# Patient Record
Sex: Female | Born: 1935 | Race: White | Hispanic: No
Health system: Southern US, Community
[De-identification: ages and names within clinical notes are randomized; demographics above are authoritative.]

---

## 2020-07-22 ENCOUNTER — Other Ambulatory Visit: Payer: Self-pay

## 2020-07-22 DIAGNOSIS — Z20822 Contact with and (suspected) exposure to covid-19: Secondary | ICD-10-CM

## 2020-07-25 ENCOUNTER — Telehealth: Payer: Self-pay

## 2020-07-25 NOTE — Telephone Encounter (Signed)
Pt's home health nurse called for results advised results are not back. 

## 2020-07-26 LAB — NOVEL CORONAVIRUS, NAA: SARS-CoV-2, NAA: NOT DETECTED

## 2022-03-12 ENCOUNTER — Emergency Department (HOSPITAL_COMMUNITY): Payer: Medicare Other

## 2022-03-12 ENCOUNTER — Inpatient Hospital Stay (HOSPITAL_COMMUNITY)
Admission: EM | Admit: 2022-03-12 | Discharge: 2022-03-15 | DRG: 064 | Disposition: A | Discharge status: Hospice | Payer: Medicare Other | Source: Skilled Nursing Facility | Attending: Family Medicine | Admitting: Family Medicine

## 2022-03-12 ENCOUNTER — Observation Stay (HOSPITAL_COMMUNITY): Payer: Medicare Other

## 2022-03-12 ENCOUNTER — Encounter (HOSPITAL_COMMUNITY): Payer: Self-pay | Admitting: Emergency Medicine

## 2022-03-12 ENCOUNTER — Other Ambulatory Visit: Payer: Self-pay

## 2022-03-12 DIAGNOSIS — Z8673 Personal history of transient ischemic attack (TIA), and cerebral infarction without residual deficits: Secondary | ICD-10-CM

## 2022-03-12 DIAGNOSIS — I10 Essential (primary) hypertension: Secondary | ICD-10-CM | POA: Diagnosis present

## 2022-03-12 DIAGNOSIS — E876 Hypokalemia: Secondary | ICD-10-CM | POA: Diagnosis not present

## 2022-03-12 DIAGNOSIS — R471 Dysarthria and anarthria: Secondary | ICD-10-CM | POA: Diagnosis present

## 2022-03-12 DIAGNOSIS — R627 Adult failure to thrive: Secondary | ICD-10-CM | POA: Diagnosis present

## 2022-03-12 DIAGNOSIS — Z91040 Latex allergy status: Secondary | ICD-10-CM

## 2022-03-12 DIAGNOSIS — G9341 Metabolic encephalopathy: Secondary | ICD-10-CM | POA: Diagnosis present

## 2022-03-12 DIAGNOSIS — F03918 Unspecified dementia, unspecified severity, with other behavioral disturbance: Secondary | ICD-10-CM | POA: Diagnosis present

## 2022-03-12 DIAGNOSIS — Z881 Allergy status to other antibiotic agents status: Secondary | ICD-10-CM

## 2022-03-12 DIAGNOSIS — Z66 Do not resuscitate: Secondary | ICD-10-CM | POA: Diagnosis present

## 2022-03-12 DIAGNOSIS — F01518 Vascular dementia, unspecified severity, with other behavioral disturbance: Secondary | ICD-10-CM | POA: Diagnosis present

## 2022-03-12 DIAGNOSIS — F05 Delirium due to known physiological condition: Secondary | ICD-10-CM | POA: Diagnosis present

## 2022-03-12 DIAGNOSIS — R4701 Aphasia: Secondary | ICD-10-CM | POA: Diagnosis present

## 2022-03-12 DIAGNOSIS — I639 Cerebral infarction, unspecified: Secondary | ICD-10-CM | POA: Diagnosis not present

## 2022-03-12 DIAGNOSIS — I6381 Other cerebral infarction due to occlusion or stenosis of small artery: Secondary | ICD-10-CM | POA: Diagnosis not present

## 2022-03-12 DIAGNOSIS — Z515 Encounter for palliative care: Secondary | ICD-10-CM

## 2022-03-12 DIAGNOSIS — R159 Full incontinence of feces: Secondary | ICD-10-CM | POA: Diagnosis present

## 2022-03-12 DIAGNOSIS — R4182 Altered mental status, unspecified: Secondary | ICD-10-CM

## 2022-03-12 DIAGNOSIS — Z79899 Other long term (current) drug therapy: Secondary | ICD-10-CM

## 2022-03-12 DIAGNOSIS — R29706 NIHSS score 6: Secondary | ICD-10-CM | POA: Diagnosis present

## 2022-03-12 DIAGNOSIS — F0154 Vascular dementia, unspecified severity, with anxiety: Secondary | ICD-10-CM | POA: Diagnosis present

## 2022-03-12 DIAGNOSIS — Z7982 Long term (current) use of aspirin: Secondary | ICD-10-CM

## 2022-03-12 DIAGNOSIS — Z853 Personal history of malignant neoplasm of breast: Secondary | ICD-10-CM

## 2022-03-12 DIAGNOSIS — E785 Hyperlipidemia, unspecified: Secondary | ICD-10-CM | POA: Diagnosis present

## 2022-03-12 DIAGNOSIS — I2510 Atherosclerotic heart disease of native coronary artery without angina pectoris: Secondary | ICD-10-CM | POA: Diagnosis present

## 2022-03-12 DIAGNOSIS — E039 Hypothyroidism, unspecified: Secondary | ICD-10-CM | POA: Diagnosis present

## 2022-03-12 DIAGNOSIS — R32 Unspecified urinary incontinence: Secondary | ICD-10-CM | POA: Diagnosis present

## 2022-03-12 LAB — APTT: aPTT: 29 s (ref 24–36)

## 2022-03-12 LAB — CBC
HCT: 38.1 % (ref 36.0–46.0)
Hemoglobin: 12.4 g/dL (ref 12.0–15.0)
MCH: 29.3 pg (ref 26.0–34.0)
MCHC: 32.5 g/dL (ref 30.0–36.0)
MCV: 90.1 fL (ref 80.0–100.0)
Platelets: 168 K/uL (ref 150–400)
RBC: 4.23 MIL/uL (ref 3.87–5.11)
RDW: 14 % (ref 11.5–15.5)
WBC: 5 K/uL (ref 4.0–10.5)
nRBC: 0 % (ref 0.0–0.2)

## 2022-03-12 LAB — DIFFERENTIAL
Abs Immature Granulocytes: 0.01 10*3/uL (ref 0.00–0.07)
Basophils Absolute: 0.1 10*3/uL (ref 0.0–0.1)
Basophils Relative: 1 %
Eosinophils Absolute: 0.1 10*3/uL (ref 0.0–0.5)
Eosinophils Relative: 1 %
Immature Granulocytes: 0 %
Lymphocytes Relative: 25 %
Lymphs Abs: 1.3 10*3/uL (ref 0.7–4.0)
Monocytes Absolute: 0.4 10*3/uL (ref 0.1–1.0)
Monocytes Relative: 7 %
Neutro Abs: 3.3 10*3/uL (ref 1.7–7.7)
Neutrophils Relative %: 66 %

## 2022-03-12 LAB — COMPREHENSIVE METABOLIC PANEL
ALT: 12 U/L (ref 0–44)
AST: 20 U/L (ref 15–41)
Albumin: 3 g/dL — ABNORMAL LOW (ref 3.5–5.0)
Alkaline Phosphatase: 49 U/L (ref 38–126)
Anion gap: 5 (ref 5–15)
BUN: 10 mg/dL (ref 8–23)
CO2: 28 mmol/L (ref 22–32)
Calcium: 8.2 mg/dL — ABNORMAL LOW (ref 8.9–10.3)
Chloride: 111 mmol/L (ref 98–111)
Creatinine, Ser: 0.67 mg/dL (ref 0.44–1.00)
GFR, Estimated: >60 mL/min (ref >60)
Glucose, Bld: 89 mg/dL (ref 70–99)
Potassium: 2.7 mmol/L — CL (ref 3.5–5.1)
Sodium: 144 mmol/L (ref 135–145)
Total Bilirubin: 0.6 mg/dL (ref 0.3–1.2)
Total Protein: 5.6 g/dL — ABNORMAL LOW (ref 6.5–8.1)

## 2022-03-12 LAB — I-STAT CHEM 8, ED
BUN: 12 mg/dL (ref 8–23)
Calcium, Ion: 1.11 mmol/L — ABNORMAL LOW (ref 1.15–1.40)
Chloride: 106 mmol/L (ref 98–111)
Creatinine, Ser: 0.6 mg/dL (ref 0.44–1.00)
Glucose, Bld: 88 mg/dL (ref 70–99)
HCT: 35 % — ABNORMAL LOW (ref 36.0–46.0)
Hemoglobin: 11.9 g/dL — ABNORMAL LOW (ref 12.0–15.0)
Potassium: 3.1 mmol/L — ABNORMAL LOW (ref 3.5–5.1)
Sodium: 143 mmol/L (ref 135–145)
TCO2: 28 mmol/L (ref 22–32)

## 2022-03-12 LAB — PROTIME-INR
INR: 1.1 (ref 0.8–1.2)
Prothrombin Time: 13.9 seconds (ref 11.4–15.2)

## 2022-03-12 LAB — URINALYSIS, ROUTINE W REFLEX MICROSCOPIC
Bilirubin Urine: NEGATIVE
Glucose, UA: NEGATIVE mg/dL
Hgb urine dipstick: NEGATIVE
Ketones, ur: NEGATIVE mg/dL
Leukocytes,Ua: NEGATIVE
Nitrite: NEGATIVE
Protein, ur: NEGATIVE mg/dL
Specific Gravity, Urine: 1.02 (ref 1.005–1.030)
pH: 6 (ref 5.0–8.0)

## 2022-03-12 LAB — TSH: TSH: 1.467 u[IU]/mL (ref 0.350–4.500)

## 2022-03-12 LAB — MAGNESIUM: Magnesium: 1.9 mg/dL (ref 1.7–2.4)

## 2022-03-12 LAB — AMMONIA: Ammonia: 17 umol/L (ref 9–35)

## 2022-03-12 MED ORDER — SODIUM CHLORIDE 0.9% FLUSH
3.0000 mL | Freq: Once | INTRAVENOUS | Status: AC
  Administered 2022-03-12: 3 mL via INTRAVENOUS

## 2022-03-12 MED ORDER — SENNOSIDES-DOCUSATE SODIUM 8.6-50 MG PO TABS
1.0000 | ORAL_TABLET | Freq: Every evening | ORAL | Status: DC | PRN
  Filled 2022-03-12: qty 1

## 2022-03-12 MED ORDER — POTASSIUM CHLORIDE 10 MEQ/100ML IV SOLN
INTRAVENOUS | Status: AC
  Administered 2022-03-12: 10 meq
  Filled 2022-03-12: qty 100

## 2022-03-12 MED ORDER — LORAZEPAM 2 MG/ML IJ SOLN
1.0000 mg | Freq: Once | INTRAMUSCULAR | Status: AC
  Administered 2022-03-12: 1 mg via INTRAVENOUS
  Filled 2022-03-12: qty 1

## 2022-03-12 MED ORDER — ACETAMINOPHEN 650 MG RE SUPP: 650.0000 mg | RECTAL | Status: DC | PRN

## 2022-03-12 MED ORDER — POTASSIUM CHLORIDE 10 MEQ/100ML IV SOLN
10.0000 meq | INTRAVENOUS | Status: AC
  Administered 2022-03-12 (×3): 10 meq via INTRAVENOUS
  Filled 2022-03-12 (×3): qty 100

## 2022-03-12 MED ORDER — SODIUM CHLORIDE 0.9 % IV SOLN: INTRAVENOUS | Status: DC

## 2022-03-12 MED ORDER — ACETAMINOPHEN 325 MG PO TABS
650.0000 mg | ORAL_TABLET | ORAL | Status: DC | PRN
  Administered 2022-03-15: 650 mg via ORAL
  Filled 2022-03-12 (×2): qty 2

## 2022-03-12 MED ORDER — ATORVASTATIN CALCIUM 80 MG PO TABS
80.0000 mg | ORAL_TABLET | Freq: Every day | ORAL | Status: DC
  Administered 2022-03-13 – 2022-03-15 (×3): 80 mg via ORAL
  Filled 2022-03-12 (×5): qty 1

## 2022-03-12 MED ORDER — ACETAMINOPHEN 160 MG/5ML PO SOLN: 650.0000 mg | ORAL | Status: DC | PRN

## 2022-03-12 MED ORDER — ASPIRIN EC 81 MG PO TBEC
81.0000 mg | DELAYED_RELEASE_TABLET | Freq: Every day | ORAL | Status: DC
  Filled 2022-03-12: qty 1

## 2022-03-12 MED ORDER — STROKE: EARLY STAGES OF RECOVERY BOOK
Freq: Once | Status: DC
  Filled 2022-03-12 (×2): qty 1

## 2022-03-12 NOTE — ED Notes (Signed)
Pt returned from MRI °

## 2022-03-12 NOTE — ED Notes (Signed)
Lab called to add on mg.  ?

## 2022-03-12 NOTE — ED Triage Notes (Signed)
Pt BIB EMS from [facility] memory care, facility reports that pt was [facility] prior to going to bed. Found at approx 9am slumped over in her wheelchair, has had increased confusion, aphasia, and difficult to arouse. EMS reports that family states pt has hx of TIA.  ?

## 2022-03-12 NOTE — ED Notes (Signed)
MD Criss Alvine made aware of K+ 2.7 ?

## 2022-03-12 NOTE — Assessment & Plan Note (Signed)
-   will replace and repeat in AM,  check magnesium level and replace as needed ° °

## 2022-03-12 NOTE — Assessment & Plan Note (Signed)
-   will admit based on TIA/CVA protocol       Monitor on Tele ?       MRI Resulted - showing acute ischemic CVA  ?     NEurology have seen pt suspect MRI abnormality is an incidental finding ?      Echo to evaluate for possible embolic source,  ?      obtain cardiac enzymes,  ECG,   Lipid panel, TSH WNL.  ?      Order PT/OT evaluation.  ?      keep nothing by mouth until passes swallow eval  ?      Will make sure patient is on antiplatelet ASA 81  agent and statin  ?      Allow permissive Hypertension keep BP <220/120  ?      Neurology consulted Have seen pt in ER  ? ?

## 2022-03-12 NOTE — ED Notes (Signed)
Pt transported to MRI 

## 2022-03-12 NOTE — Assessment & Plan Note (Addendum)
Felt to be due to sundowning ?UA no evidence of infection ?MRi showing small right occipital CVA,  ?If pt vision was affected it could have upset her resulting in aggressive behavior ?EEG per neurology  ? ?

## 2022-03-12 NOTE — Code Documentation (Addendum)
Stroke Response Nurse Documentation ?Code Documentation ? ?Tammie Wells is a 86 y.o. female arriving to [facility]  via [facility] on 03/12/22 with past medical hx of TIA. On No antithrombotic. Code stroke was activated by EMS.  ? ?Patient from [facility] where she was LKW at unknown time last evening and now complaining of aphasia. Pt was LKW sometime last evening, third shift was going to get pt ready for breakfast. They found her slumped over in the wheelchair with AMS beyond baseline. EMS was called.   ? ?Stroke team at the bedside on patient arrival. Labs drawn and patient cleared for CT by EDP. Patient to CT with team. NIHSS 6, see documentation for details and code stroke times. Patient with disoriented, right hemianopia, and Expressive aphasia  on exam. The following imaging was completed:  CT Head. Patient is not a candidate for IV Thrombolytic due to outside the window. Patient is not not a candidate for IR due to MRS high and scan results per MD.  ? ?Care Plan: Q2x12 then Q4 Vitals/NIHSS, permissive HTN <220/120.  ? ?Bedside handoff with ED RN Tammie Wells.   ? ?Tammie Wells, Tammie Wells  ?Stroke Response RN ? ?Per EMS from the facility, POA is Tammie Wells and no one else should get information about the patient but her.  ?

## 2022-03-12 NOTE — ED Provider Notes (Signed)
?[facility] EMERGENCY DEPARTMENT ?Provider Note ? ? ?CSN: [id number] ?Arrival date & time: 03/12/22  1202 ? ?An emergency department physician performed an initial assessment on this suspected stroke patient at 1202. ? ?History ? ?Chief Complaint  ?Patient presents with  ? Code Stroke  ? ? ?Tammie Wells is a 86 y.o. female. ? ?86 year old female brought in by EMS from [facility] living memory care who reports change in mental status, last known well prior to going to bed last night, found at 9 AM slumped over in a wheelchair with increasing confusion, aphasia, difficulty to arouse.  Past medical history of, vascular dementia, hypertension, hyperlipidemia, breast cancer. ?Patient is unable to provide any history, level 5 caveat applies. ? ? ?  ? ?Home Medications ?Prior to Admission medications   ?Medication Sig Start Date End Date Taking? Authorizing Provider  ?aspirin 81 MG EC tablet Take 81 mg by mouth daily.   Yes [provider]  ?atorvastatin (LIPITOR) 80 MG tablet Take 80 mg by mouth daily. 01/22/22  Yes [provider]  ?gabapentin (NEURONTIN) 100 MG capsule Take 100 mg by mouth 2 (two) times daily. 02/24/22  Yes [provider]  ?lisinopril (ZESTRIL) 20 MG tablet Take 20 mg by mouth daily. 01/22/22  Yes [provider]  ?metoprolol succinate (TOPROL-XL) 25 MG 24 hr tablet Take 25 mg by mouth daily. 01/22/22  Yes [provider]  ?sertraline (ZOLOFT) 50 MG tablet Take 50 mg by mouth daily. 01/22/22  Yes [provider]  ?Vitamin D, Ergocalciferol, (DRISDOL) 1.25 MG (50000 UNIT) CAPS capsule Take 50,000 Units by mouth once a week. 12/19/21  Yes [provider]  ?   ? ?Allergies    ?Latex and Sulfa antibiotics   ? ?Review of Systems   ?Review of Systems  ?Unable to perform ROS: Dementia  ? ?Physical Exam ?Updated Vital Signs ?BP (!) 163/103   Pulse 95   Temp 98.8 ?F (37.1 ?C) (Oral)   Resp 20   Wt 54 kg   SpO2 96%  ?Physical  Exam ?Vitals and nursing note reviewed.  ?Constitutional:   ?   General: She is not in acute distress. ?   Appearance: She is well-developed. She is not diaphoretic.  ?HENT:  ?   Head: Normocephalic and atraumatic.  ?   Nose: Nose normal.  ?   Mouth/Throat:  ?   Mouth: Mucous membranes are dry.  ?Eyes:  ?   Conjunctiva/sclera: Conjunctivae normal.  ?   Pupils: Pupils are equal, round, and reactive to light.  ?Cardiovascular:  ?   Rate and Rhythm: Normal rate and regular rhythm.  ?   Pulses: Normal pulses.  ?   Heart sounds: Normal heart sounds.  ?Pulmonary:  ?   Effort: Pulmonary effort is normal.  ?   Breath sounds: Normal breath sounds.  ?Abdominal:  ?   Palpations: Abdomen is soft.  ?   Tenderness: There is no abdominal tenderness.  ?Musculoskeletal:     ?   General: No swelling, tenderness or deformity.  ?   Cervical back: Neck supple.  ?   Right lower leg: No edema.  ?   Left lower leg: No edema.  ?Skin: ?   General: Skin is warm and dry.  ?   Findings: No erythema or rash.  ?Neurological:  ?   Mental Status: She is alert.  ?   Comments: Alert to person only. Does follow simple commands.  ?Psychiatric:     ?  Behavior: Behavior normal.  ? ? ?ED Results / Procedures / Treatments   ?Labs ?(all labs ordered are listed, but only abnormal results are displayed) ?Labs Reviewed  ?COMPREHENSIVE METABOLIC PANEL - Abnormal; Notable for the following components:  ?    Result Value  ? Potassium 2.7 (*)   ? Calcium 8.2 (*)   ? Total Protein 5.6 (*)   ? Albumin 3.0 (*)   ? All other components within normal limits  ?I-STAT CHEM 8, ED - Abnormal; Notable for the following components:  ? Potassium 3.1 (*)   ? Calcium, Ion 1.11 (*)   ? Hemoglobin 11.9 (*)   ? HCT 35.0 (*)   ? All other components within normal limits  ?PROTIME-INR  ?APTT  ?CBC  ?DIFFERENTIAL  ?URINALYSIS, ROUTINE W REFLEX MICROSCOPIC  ?MAGNESIUM  ?AMMONIA  ?TSH  ?CBG MONITORING, ED  ? ? ?EKG ?None ? ?Radiology ?MR BRAIN WO CONTRAST ? ?Result Date:  03/12/2022 ?CLINICAL DATA:  Found approximately 9 a.m. slumped over in wheelchair, confusion, aphasia, difficulty arousing. History of TIA EXAM: MRI HEAD WITHOUT CONTRAST TECHNIQUE: Multiplanar, multiecho pulse sequences of the brain and surrounding structures were obtained without intravenous contrast. COMPARISON:  Same-day noncontrast CT head FINDINGS: Study could not be completed due to patient cooperation. Motion degraded axial and coronal DWI, and axial FLAIR sequences were obtained. Brain: There is a small focus of elevated DWI signal in the right occipital lobe periventricular white matter seen on the coronal DWI sequence suspicious for acute to early subacute infarct (9-42). There is no other evidence of acute infarct. There is no large intraparenchymal hemorrhage. There is mild-to-moderate global parenchymal volume loss with prominence of the ventricular system and extra-axial CSF spaces. The ventricles are similar in size compared to the CT from 2020. There is extensive confluent FLAIR signal abnormality throughout the subcortical and periventricular white matter likely reflecting sequela of advanced chronic white matter microangiopathy. There are remote lacunar infarcts in the bilateral thalami and left lentiform nucleus. There is no large mass lesion.  There is no midline shift. Vascular: Not well assessed. Skull and upper cervical spine: Not assessed. Sinuses/Orbits: Paranasal sinuses appear grossly clear. The globes and orbits are not well assessed. Other: None. IMPRESSION: 1. Incomplete study due to patient cooperation as above. 2. Probable small acute to early subacute infarct in the right occipital lobe periventricular white matter. No other evidence of acute intracranial pathology. 3. Global parenchymal volume loss and advanced chronic white matter microangiopathy. Electronically Signed   By: Lesia Hausen M.D.   On: 03/12/2022 17:52  ? ?CT HEAD CODE STROKE WO CONTRAST ? ?Result Date:  03/12/2022 ?CLINICAL DATA:  Code stroke. Neuro deficit, acute, stroke suspected. EXAM: CT HEAD WITHOUT CONTRAST TECHNIQUE: Contiguous axial images were obtained from the base of the skull through the vertex without intravenous contrast. RADIATION DOSE REDUCTION: This exam was performed according to the departmental dose-optimization program which includes automated exposure control, adjustment of the mA and/or kV according to patient size and/or use of iterative reconstruction technique. COMPARISON:  06/10/2019 FINDINGS: Brain: No acute finding. Extensive chronic small-vessel ischemic changes throughout the cerebral hemispheric white matter. Old small vessel infarctions of the thalami and basal ganglia. No large vessel territory stroke. No mass, hemorrhage, hydrocephalus or extra-axial collection. Vascular: There is atherosclerotic calcification of the major vessels at the base of the brain. Skull: Negative Sinuses/Orbits: Clear/normal Other: None ASPECTS (Alberta Stroke Program Early CT Score) - Ganglionic level infarction (caudate, lentiform nuclei, internal capsule, insula, M1-M3 cortex):  7 - Supraganglionic infarction (M4-M6 cortex): 3 Total score (0-10 with 10 being normal): 10 IMPRESSION: 1. No acute CT finding. Advanced chronic small-vessel ischemic changes of the thalami, basal ganglia and cerebral hemispheric white matter. 2. ASPECTS is 10. 3. These results were communicated to Dr. Amada JupiterKirkpatrick at 12:17 pm on 03/12/2022 by text page via the [facility] messaging system. Electronically Signed   By: Paulina FusiMark  Shogry M.D.   On: 03/12/2022 12:18   ? ?Procedures ?Procedures  ? ? ?Medications Ordered in ED ?Medications  ?potassium chloride 10 mEq in 100 mL IVPB (10 mEq Intravenous New Bag/Given 03/12/22 1750)  ?sodium chloride flush (NS) 0.9 % injection 3 mL (3 mLs Intravenous Given 03/12/22 1229)  ?LORazepam (ATIVAN) injection 1 mg (1 mg Intravenous Given 03/12/22 1753)  ? ? ?ED Course/ Medical Decision Making/ A&P ?  ?                         ?Medical Decision Making ?Amount and/or Complexity of Data Reviewed ?Labs: ordered. ?Radiology: ordered. ? ?Risk ?Prescription drug management. ?Decision regarding hospitalization. ? ? ?This patient presents to the ED for concern

## 2022-03-12 NOTE — H&P (Signed)
? ? ?Tammie Medicusnn Winward ZOX:[id number] DOB: [birth year] DOA: 03/12/2022 ? ? ?  ?PCP: Ashley Royaltyhigpen, Jacqueline R, NP   ?  ? ?Patient arrived to ER on 03/12/22 at 1202 ?Referred by Attending Therisa Doyneoutova, Fareed Fung, MD ? ? ?Patient coming from:    ?From facility   ? ?Chief Complaint:  ?Chief Complaint  ?Patient presents with  ? Code Stroke  ? ? ?HPI: ?Tammie Wells is a 86 y.o. female with medical history significant of Dementia, expressive aphasia ?  ? ?Presented with   confusion ?Was brought in from facility for worsening confusion and episode of aphasia ?Pt with significant dementia unable to provide her own history ? At baseline bed bound, able to feed her finger food ?  ?  ?Regarding pertinent Chronic problems:   ?  ? ? HTN on lisinopril, metoprolol ?  ? ?  ?  Dementia -  ON NEURONTIN ?  ? ?While in ER: ?  ? ?MRI - Probable small acute to early subacute infarct in the right ?occipital lobe periventricular white matter. ? ? ?Ordered ? ?CT HEAD No acute CT finding. Advanced chronic small-vessel ischemic ?changes of the thalami, basal ganglia and cerebral hemispheric white ?matter. ? ?CXR - Negative abdominal radiographs.  No acute cardiopulmonary disease. ?KUB - WNL ? ? ?Following Medications were ordered in ER: ?Medications  ?potassium chloride 10 mEq in 100 mL IVPB (10 mEq Intravenous New Bag/Given 03/12/22 1750)  ?sodium chloride flush (NS) 0.9 % injection 3 mL (3 mLs Intravenous Given 03/12/22 1229)  ?LORazepam (ATIVAN) injection 1 mg (1 mg Intravenous Given 03/12/22 1753)  ?  ?_______________________________________________________ ?ER Provider Called:  Neurology   Dr. Amada JupiterKirkpatrick ?They Recommend admit to medicine  ?SEEN in ER ?  ?ED Triage Vitals  ?Enc Vitals Group  ?   BP 03/12/22 1224 (!) 173/84  ?   Pulse Rate 03/12/22 1224 73  ?   Resp 03/12/22 1224 18  ?   Temp 03/12/22 1224 98.8 ?F (37.1 ?C)  ?   Temp Source 03/12/22 1224 Oral  ?   SpO2 03/12/22 1220 96 %  ?   Weight 03/12/22 1200 119 lb 0.8 oz (54 kg)  ?   Height --   ?   Head  Circumference --   ?   Peak Flow --   ?   Pain Score --   ?   Pain Loc --   ?   Pain Edu? --   ?   Excl. in GC? --   ?WJXB(14)@TMAX(24)@    ? _________________________________________ ?Significant initial  Findings: ?Abnormal Labs Reviewed  ?COMPREHENSIVE METABOLIC PANEL - Abnormal; Notable for the following components:  ?    Result Value  ? Potassium 2.7 (*)   ? Calcium 8.2 (*)   ? Total Protein 5.6 (*)   ? Albumin 3.0 (*)   ? All other components within normal limits  ?I-STAT CHEM 8, ED - Abnormal; Notable for the following components:  ? Potassium 3.1 (*)   ? Calcium, Ion 1.11 (*)   ? Hemoglobin 11.9 (*)   ? HCT 35.0 (*)   ? All other components within normal limits  ? ? ?ECG: Ordered ?Personally reviewed by me showing: ?HR : 66  ?Rhythm: Sinus rhythm ?Ventricular trigeminy ?Consider anterior infarct ?QTC 475 ? ? ?The recent clinical data is shown below. ?Vitals:  ? 03/12/22 1615 03/12/22 1630 03/12/22 1800 03/12/22 1813  ?BP: (!) 190/104 (!) 177/109 (!) 177/104 (!) 163/103  ?Pulse: 87 85 98 95  ?Resp: 17  19 20 20   ?Temp:      ?TempSrc:      ?SpO2: 98% 98% 99% 96%  ?Weight:      ? ?   ?WBC ? ?   ?Component Value Date/Time  ? WBC 5.0 03/12/2022 1247  ? LYMPHSABS 1.3 03/12/2022 1247  ? MONOABS 0.4 03/12/2022 1247  ? EOSABS 0.1 03/12/2022 1247  ? BASOSABS 0.1 03/12/2022 1247  ?  ? ? UA   no evidence of UTI    ?  ?Urine analysis: ?   ?Component Value Date/Time  ? COLORURINE YELLOW 03/12/2022 1320  ? APPEARANCEUR CLEAR 03/12/2022 1320  ? LABSPEC 1.020 03/12/2022 1320  ? PHURINE 6.0 03/12/2022 1320  ? GLUCOSEU NEGATIVE 03/12/2022 1320  ? HGBUR NEGATIVE 03/12/2022 1320  ? [place] NEGATIVE 03/12/2022 1320  ? [place] NEGATIVE 03/12/2022 1320  ? PROTEINUR NEGATIVE 03/12/2022 1320  ? NITRITE NEGATIVE 03/12/2022 1320  ? LEUKOCYTESUR NEGATIVE 03/12/2022 1320  ?  ? ?_______________________________________________ ?Hospitalist was called for admission for acute encephalopathy, small right occipital CVA ?   ?Hypokalemia ?  ?   ? ?The following Work up has been ordered so far: ? ?Orders Placed This Encounter  ?Procedures  ? CT HEAD CODE STROKE WO CONTRAST  ? MR BRAIN WO CONTRAST  ? DG Abd Acute W/Chest  ? Protime-INR  ? APTT  ? CBC  ? Differential  ? Comprehensive metabolic panel  ? Urinalysis, Routine w reflex microscopic Urine, Clean Catch  ? Magnesium  ? Ammonia  ? TSH  ? Basic metabolic panel  ? CK  ? Phosphorus  ? Prealbumin  ? Diet NPO time specified  ? Cardiac monitoring  ? Swallow screen  ? NIH Stroke Scale  ? Modified Stroke Scale (mNIHSS) Document mNIHSS assessment every 2 hours for a total of 12 hours  ? [place] IV, Maintain IV access  ? If O2 sat  ? Cardiac monitoring  ? Consult for [place] Admission  ? Consult to hospitalist  ? Pulse oximetry, continuous  ? I-stat chem 8, ED  ? CBG monitoring, ED  ? ED EKG  ? EKG 12-Lead  ? EEG adult  ? Place in observation (patient's expected length of stay will be less than 2 midnights)  ?  ? ?OTHER Significant initial  Findings: ? ?labs showing: ? ?  ?Recent Labs  ?Lab 03/12/22 ?1247 03/12/22 ?1357  ?NA 144 143  ?K 2.7* 3.1*  ?CO2 28  --   ?GLUCOSE 89 88  ?BUN 10 12  ?CREATININE 0.67 0.60  ?CALCIUM 8.2*  --   ?MG 1.9  --   ? ? ?Cr  stable,    ?Lab Results  ?Component Value Date  ? CREATININE 0.60 03/12/2022  ? CREATININE 0.67 03/12/2022  ? ? ?Recent Labs  ?Lab 03/12/22 ?1247  ?AST 20  ?ALT 12  ?ALKPHOS 49  ?BILITOT 0.6  ?PROT 5.6*  ?ALBUMIN 3.0*  ? ?Lab Results  ?Component Value Date  ? CALCIUM 8.2 (L) 03/12/2022  ? ?     ?Plt: ?Lab Results  ?Component Value Date  ? PLT 168 03/12/2022  ? ?  ?   ?Recent Labs  ?Lab 03/12/22 ?1247 03/12/22 ?1357  ?WBC 5.0  --   ?NEUTROABS 3.3  --   ?HGB 12.4 11.9*  ?HCT 38.1 35.0*  ?MCV 90.1  --   ?PLT 168  --   ? ? ?HG/HCT  stable,   ?   ?Component Value Date/Time  ? HGB 11.9 (L) 03/12/2022 1357  ?  HCT 35.0 (L) 03/12/2022 1357  ? MCV 90.1 03/12/2022 1247  ? ?  ? ?No results for input(s): LIPASE, AMYLASE in the last 168 hours. ?Recent Labs   ?Lab 03/12/22 ?1720  ?AMMONIA 17  ? ?   ?    ?Cultures: ?No results found for: SDES, [place], [place], REPTSTATUS ?  ?Radiological Exams on Admission: ?MR BRAIN WO CONTRAST ? ?Result Date: 03/12/2022 ?CLINICAL DATA:  Found approximately 9 a.m. slumped over in wheelchair, confusion, aphasia, difficulty arousing. History of TIA EXAM: MRI HEAD WITHOUT CONTRAST TECHNIQUE: Multiplanar, multiecho pulse sequences of the brain and surrounding structures were obtained without intravenous contrast. COMPARISON:  Same-day noncontrast CT head FINDINGS: Study could not be completed due to patient cooperation. Motion degraded axial and coronal DWI, and axial FLAIR sequences were obtained. Brain: There is a small focus of elevated DWI signal in the right occipital lobe periventricular white matter seen on the coronal DWI sequence suspicious for acute to early subacute infarct (9-42). There is no other evidence of acute infarct. There is no large intraparenchymal hemorrhage. There is mild-to-moderate global parenchymal volume loss with prominence of the ventricular system and extra-axial CSF spaces. The ventricles are similar in size compared to the CT from 2020. There is extensive confluent FLAIR signal abnormality throughout the subcortical and periventricular white matter likely reflecting sequela of advanced chronic white matter microangiopathy. There are remote lacunar infarcts in the bilateral thalami and left lentiform nucleus. There is no large mass lesion.  There is no midline shift. Vascular: Not well assessed. Skull and upper cervical spine: Not assessed. Sinuses/Orbits: Paranasal sinuses appear grossly clear. The globes and orbits are not well assessed. Other: None. IMPRESSION: 1. Incomplete study due to patient cooperation as above. 2. Probable small acute to early subacute infarct in the right occipital lobe periventricular white matter. No other evidence of acute intracranial pathology. 3. Global parenchymal volume  loss and advanced chronic white matter microangiopathy. Electronically Signed   By: Valetta Mole M.D.   On: 03/12/2022 17:52  ? ?CT HEAD CODE STROKE WO CONTRAST ? ?Result Date: 03/12/2022 ?CLINICAL DATA:  Code stroke.

## 2022-03-12 NOTE — Consult Note (Signed)
Neurology Consultation ?Reason for Consult: AMS ?Referring Physician: Elvin So, S ? ?CC: Altered mental status ? ?History is obtained from: Patient, EMS ? ?HPI: Tammie Wells is a 86 y.o. female with a history of fairly advanced dementia, requiring significant help to go about her daily activities.  She also has notes in the chart that she has a history of aphasia, but it was noted to be significantly worse today and therefore the patient was brought in as a code stroke. ? ?There is no clear instigating event, no clear recent illnesses. ? ? ?LKW: 4/30 prior to bed ?tpa given?: no, out of window ?Premorbid modified rankin scale: Four ? ? ? ? ?History reviewed. No pertinent past medical history. ? ? ?History reviewed. No pertinent family history. ? ? ?Social History:  has no history on file for tobacco use, alcohol use, and drug use. ? ? ?Exam: ?Current vital signs: ?BP (!) 179/93   Pulse 66   Temp 98.8 ?F (37.1 ?C) (Oral)   Resp 16   Wt 54 kg   SpO2 97%  ?Vital signs in last 24 hours: ?Temp:  [98.8 ?F (37.1 ?C)] 98.8 ?F (37.1 ?C) (05/01 1224) ?Pulse Rate:  [66-73] 66 (05/01 1515) ?Resp:  [12-18] 16 (05/01 1515) ?BP: (172-179)/(84-93) 179/93 (05/01 1515) ?SpO2:  [95 %-98 %] 97 % (05/01 1515) ?Weight:  [54 kg] 54 kg (05/01 1200) ? ? ?Physical Exam  ?Constitutional: Appears well-developed and well-nourished.  ?Psych: Affect appropriate to situation ?Eyes: No scleral injection ?HENT: No OP obstruction ?MSK: no joint deformities.  ?Cardiovascular: Normal rate and regular rhythm.  ?Respiratory: Effort normal, non-labored breathing ?GI: Soft.  No distension. There is no tenderness.  ?Skin: WDI ? ?Neuro: ?Mental Status: ?Patient is awake, alert, she is able to follow commands but has a severe expressive aphasia.   ?Cranial Nerves: ?II: Right hemianopia. Pupils are equal, round, and reactive to light.   ?III,IV, VI: EOMI without ptosis or diploplia.  ?V: Facial sensation is symmetric to temperature ?VII: Facial movement is  symmetric.  ?VIII: hearing is intact to voice ?X: Uvula elevates symmetrically ?XI: Shoulder shrug is symmetric. ?XII: tongue is midline without atrophy or fasciculations.  ?Motor: ?Tone is normal. Bulk is normal. 5/5 strength was present in all four extremities.  ?Sensory: ?Sensation is symmetric to light touch and temperature in the arms and legs. ?Cerebellar: ?No clear ataxia ? ? ? ?I have reviewed labs in epic and the results pertinent to this consultation are: ?Sodium 143 ?Creatinine 0.6 ? ?I have reviewed the images obtained: CT head-negative ? ?Impression: 86 year old female with advanced dementia with worsening confusion.  She does have a right hemianopia which has not been documented previously, but I am not certain how long it has been present given her last neurology note is from 2 years ago.  It is possible that this represents acute infarct, but also possible that it could represent worsening of underlying deficits in the setting of some other physiological stressors such as UTI or other infection. ? ?Recommendations: ?1) MRI brain ?2) screening for other metabolic stressors, infectious causes per ED. ?3) ammonia, TSH ?4) further recommendations pending the above. ? ?Ritta Slot, MD ?[facility] ?[phone] ? ?If 7pm- 7am, please page neurology on call as listed in AMION. ? ?

## 2022-03-12 NOTE — Subjective & Objective (Signed)
Was brought in from facility for worsening confusion and episode of aphasia ?Pt with significant dementia unable to provide her own history ?

## 2022-03-12 NOTE — Assessment & Plan Note (Signed)
Allow permissive HTN 

## 2022-03-12 NOTE — Assessment & Plan Note (Signed)
With evidence of sundowning, will continue to redirect and treat as needed ?

## 2022-03-12 NOTE — Progress Notes (Signed)
EEG complete - results pending 

## 2022-03-13 ENCOUNTER — Observation Stay (HOSPITAL_COMMUNITY): Payer: Medicare Other

## 2022-03-13 ENCOUNTER — Encounter (HOSPITAL_COMMUNITY): Payer: Self-pay | Admitting: Internal Medicine

## 2022-03-13 DIAGNOSIS — Z853 Personal history of malignant neoplasm of breast: Secondary | ICD-10-CM | POA: Diagnosis not present

## 2022-03-13 DIAGNOSIS — Z91040 Latex allergy status: Secondary | ICD-10-CM | POA: Diagnosis not present

## 2022-03-13 DIAGNOSIS — F01518 Vascular dementia, unspecified severity, with other behavioral disturbance: Secondary | ICD-10-CM | POA: Diagnosis present

## 2022-03-13 DIAGNOSIS — R4701 Aphasia: Secondary | ICD-10-CM | POA: Diagnosis present

## 2022-03-13 DIAGNOSIS — I6389 Other cerebral infarction: Secondary | ICD-10-CM

## 2022-03-13 DIAGNOSIS — R627 Adult failure to thrive: Secondary | ICD-10-CM | POA: Diagnosis present

## 2022-03-13 DIAGNOSIS — Z79899 Other long term (current) drug therapy: Secondary | ICD-10-CM | POA: Diagnosis not present

## 2022-03-13 DIAGNOSIS — Z66 Do not resuscitate: Secondary | ICD-10-CM | POA: Diagnosis present

## 2022-03-13 DIAGNOSIS — F0154 Vascular dementia, unspecified severity, with anxiety: Secondary | ICD-10-CM | POA: Diagnosis present

## 2022-03-13 DIAGNOSIS — E876 Hypokalemia: Secondary | ICD-10-CM | POA: Diagnosis present

## 2022-03-13 DIAGNOSIS — F03918 Unspecified dementia, unspecified severity, with other behavioral disturbance: Secondary | ICD-10-CM | POA: Diagnosis not present

## 2022-03-13 DIAGNOSIS — Z7982 Long term (current) use of aspirin: Secondary | ICD-10-CM | POA: Diagnosis not present

## 2022-03-13 DIAGNOSIS — E785 Hyperlipidemia, unspecified: Secondary | ICD-10-CM | POA: Diagnosis present

## 2022-03-13 DIAGNOSIS — G9341 Metabolic encephalopathy: Secondary | ICD-10-CM | POA: Diagnosis present

## 2022-03-13 DIAGNOSIS — R32 Unspecified urinary incontinence: Secondary | ICD-10-CM | POA: Diagnosis present

## 2022-03-13 DIAGNOSIS — I639 Cerebral infarction, unspecified: Secondary | ICD-10-CM | POA: Diagnosis present

## 2022-03-13 DIAGNOSIS — Z515 Encounter for palliative care: Secondary | ICD-10-CM | POA: Diagnosis not present

## 2022-03-13 DIAGNOSIS — R159 Full incontinence of feces: Secondary | ICD-10-CM | POA: Diagnosis present

## 2022-03-13 DIAGNOSIS — Z881 Allergy status to other antibiotic agents status: Secondary | ICD-10-CM | POA: Diagnosis not present

## 2022-03-13 DIAGNOSIS — Z8673 Personal history of transient ischemic attack (TIA), and cerebral infarction without residual deficits: Secondary | ICD-10-CM | POA: Diagnosis not present

## 2022-03-13 DIAGNOSIS — E039 Hypothyroidism, unspecified: Secondary | ICD-10-CM | POA: Diagnosis present

## 2022-03-13 DIAGNOSIS — F05 Delirium due to known physiological condition: Secondary | ICD-10-CM | POA: Diagnosis present

## 2022-03-13 DIAGNOSIS — R29706 NIHSS score 6: Secondary | ICD-10-CM | POA: Diagnosis present

## 2022-03-13 DIAGNOSIS — I10 Essential (primary) hypertension: Secondary | ICD-10-CM | POA: Diagnosis present

## 2022-03-13 DIAGNOSIS — I2510 Atherosclerotic heart disease of native coronary artery without angina pectoris: Secondary | ICD-10-CM | POA: Diagnosis present

## 2022-03-13 DIAGNOSIS — I6381 Other cerebral infarction due to occlusion or stenosis of small artery: Secondary | ICD-10-CM | POA: Diagnosis present

## 2022-03-13 LAB — ECHOCARDIOGRAM COMPLETE
AR max vel: 2.55 cm2
AV Peak grad: 6.1 mmHg
Ao pk vel: 1.23 m/s
Area-P 1/2: 7.74 cm2
S' Lateral: 2.5 cm
Weight: 1904.77 oz

## 2022-03-13 LAB — BASIC METABOLIC PANEL
Anion gap: 7 (ref 5–15)
BUN: 8 mg/dL (ref 8–23)
CO2: 23 mmol/L (ref 22–32)
Calcium: 9.1 mg/dL (ref 8.9–10.3)
Chloride: 109 mmol/L (ref 98–111)
Creatinine, Ser: 0.6 mg/dL (ref 0.44–1.00)
GFR, Estimated: >60 mL/min (ref >60)
Glucose, Bld: 81 mg/dL (ref 70–99)
Potassium: 3.4 mmol/L — ABNORMAL LOW (ref 3.5–5.1)
Sodium: 139 mmol/L (ref 135–145)

## 2022-03-13 LAB — LIPID PANEL
Cholesterol: 147 mg/dL (ref 0–200)
HDL: 39 mg/dL — ABNORMAL LOW (ref >40)
LDL Cholesterol: 93 mg/dL (ref 0–99)
Total CHOL/HDL Ratio: 3.8 RATIO
Triglycerides: 77 mg/dL (ref <150)
VLDL: 15 mg/dL (ref 0–40)

## 2022-03-13 LAB — HEMOGLOBIN A1C
Hgb A1c MFr Bld: 5 % (ref 4.8–5.6)
Mean Plasma Glucose: 96.8 mg/dL

## 2022-03-13 LAB — PHOSPHORUS: Phosphorus: 2.4 mg/dL — ABNORMAL LOW (ref 2.5–4.6)

## 2022-03-13 LAB — PREALBUMIN: Prealbumin: 15.3 mg/dL — ABNORMAL LOW (ref 18–38)

## 2022-03-13 LAB — CK: Total CK: 80 U/L (ref 38–234)

## 2022-03-13 MED ORDER — ASPIRIN EC 81 MG PO TBEC
81.0000 mg | DELAYED_RELEASE_TABLET | Freq: Every day | ORAL | Status: DC
  Administered 2022-03-14 – 2022-03-15 (×2): 81 mg via ORAL
  Filled 2022-03-13 (×2): qty 1

## 2022-03-13 MED ORDER — LISINOPRIL 20 MG PO TABS
20.0000 mg | ORAL_TABLET | Freq: Every day | ORAL | Status: DC
  Administered 2022-03-14 – 2022-03-15 (×2): 20 mg via ORAL
  Filled 2022-03-13 (×2): qty 1

## 2022-03-13 MED ORDER — SERTRALINE HCL 50 MG PO TABS
50.0000 mg | ORAL_TABLET | Freq: Every day | ORAL | Status: DC
Start: 2022-03-14 — End: 2022-03-16
  Administered 2022-03-14 – 2022-03-15 (×2): 50 mg via ORAL
  Filled 2022-03-13 (×2): qty 1

## 2022-03-13 MED ORDER — EZETIMIBE 10 MG PO TABS
10.0000 mg | ORAL_TABLET | Freq: Every day | ORAL | Status: DC
  Administered 2022-03-13 – 2022-03-15 (×3): 10 mg via ORAL
  Filled 2022-03-13 (×3): qty 1

## 2022-03-13 MED ORDER — METOPROLOL SUCCINATE ER 25 MG PO TB24
25.0000 mg | ORAL_TABLET | Freq: Every day | ORAL | Status: DC
  Administered 2022-03-14 – 2022-03-15 (×2): 25 mg via ORAL
  Filled 2022-03-13 (×2): qty 1

## 2022-03-13 MED ORDER — ASPIRIN 300 MG RE SUPP
300.0000 mg | Freq: Every day | RECTAL | Status: DC
  Administered 2022-03-13: 300 mg via RECTAL
  Filled 2022-03-13: qty 1

## 2022-03-13 MED ORDER — ENOXAPARIN SODIUM 30 MG/0.3ML IJ SOSY
30.0000 mg | PREFILLED_SYRINGE | INTRAMUSCULAR | Status: DC
Start: 2022-03-14 — End: 2022-03-15
  Administered 2022-03-14 – 2022-03-15 (×2): 30 mg via SUBCUTANEOUS
  Filled 2022-03-13 (×2): qty 0.3

## 2022-03-13 MED ORDER — CLOPIDOGREL BISULFATE 75 MG PO TABS
75.0000 mg | ORAL_TABLET | Freq: Every day | ORAL | Status: DC
  Administered 2022-03-13 – 2022-03-15 (×3): 75 mg via ORAL
  Filled 2022-03-13 (×3): qty 1

## 2022-03-13 NOTE — ED Notes (Signed)
Dr. Loraine Grip paged and notified about patient blood pressure. Awaiting a page back.  ?

## 2022-03-13 NOTE — Plan of Care (Incomplete)
Lying in bed, eyes not open on voice, but able to tell me her first name in a very dysarthric voice, did not answer any other questions, did not name or repeat. Not following simple commands. With forced eye opening, no gaze palsy, eyes midline, not tracking bilaterally, slightly blinking to visual threat bilaterally. No obvious facial droop. Tongue protrusion not cooperative. Not raise up b/l arms but slow drift when both forearms were put on the upright position. Mild withdraw to BLEs on pain stimulation. Sensation, coordination and gait not tested. ? ? ?

## 2022-03-13 NOTE — ED Notes (Signed)
Patient sleeping quietly in NAD.  ?

## 2022-03-13 NOTE — Procedures (Signed)
Patient Name: Tammie Wells  ?MRN: [id number]  ?Epilepsy Attending: Lora Havens  ?Referring Physician/Provider: Greta Doom, MD ?Date: 03/12/2022 ?Duration: 23.26 mins ? ?Patient history:  86 year old female with advanced dementia with worsening confusion. EEG to evaluate for seizure ? ?Level of alertness: Awake, asleep ? ?AEDs during EEG study: None ? ?Technical aspects: This EEG study was done with scalp electrodes positioned according to the 10-20 International system of electrode placement. Electrical activity was acquired at a sampling rate of 500Hz  and reviewed with a high frequency filter of 70Hz  and a low frequency filter of 1Hz . EEG data were recorded continuously and digitally stored.  ? ?Description: The posterior dominant rhythm consists of 7 Hz activity of moderate voltage (25-35 uV) seen predominantly in posterior head regions, symmetric and reactive to eye opening and eye closing. Sleep was characterized by sleep spindles (12 to 14 Hz), maximal frontocentral region.  EEG showed continuous generalized 3 to 6 Hz theta-delta slowing. Hyperventilation and photic stimulation were not performed.    ? ?ABNORMALITY ?- Continuous slow, generalized ?- Background slow ? ?IMPRESSION: ?This study is suggestive of moderate diffuse encephalopathy, nonspecific etiology. No seizures or epileptiform discharges were seen throughout the recording. ? ?Lora Havens  ? ?

## 2022-03-13 NOTE — Evaluation (Signed)
Physical Therapy Evaluation ?Patient Details ?Name: Tammie Wells ?MRN: [id number] ?DOB: 1936/07/21 ?Today's Date: 03/13/2022 ? ?History of Present Illness ? Pt is a 86 y/o female presenting from [facility] 5/1 with worsening confusion. CT negative, MRI with probable small acute to early subacute infarct in R occipital lobe.  PMH includes: dementia, HTN, breast CA.  ?Clinical Impression ? Pt was seen for mobility with OT to assist, requiring two skilled therapists to encourage postural control, to manage deficits while testing and attempting to elicit active movement, and to work on strategies for sitting balance control.  Follow along with her to work on sit balance control for wheelchair use, and to facilitate her ability to support on LE's toward transfers or use of LE's to self propel wheelchair. ?   ? ?Recommendations for follow up therapy are one component of a multi-disciplinary discharge planning process, led by the attending physician.  Recommendations may be updated based on patient status, additional functional criteria and insurance authorization. ? ?Follow Up Recommendations Skilled nursing-short term rehab (<3 hours/day) ? ?  ?Assistance Recommended at Discharge Frequent or constant Supervision/Assistance  ?Patient can return home with the following ? Two people to help with walking and/or transfers;Two people to help with bathing/dressing/bathroom;Assistance with feeding;Direct supervision/assist for medications management;Direct supervision/assist for financial management;Assist for transportation ? ?  ?Equipment Recommendations None recommended by PT  ?Recommendations for Other Services ?    ?  ?Functional Status Assessment Patient has had a recent decline in their functional status and demonstrates the ability to make significant improvements in function in a reasonable and predictable amount of time.  ? ?  ?Precautions / Restrictions Precautions ?Precautions:  Fall ?Restrictions ?Weight Bearing Restrictions: No  ? ?  ? ?Mobility ? Bed Mobility ?Overal bed mobility: Needs Assistance ?Bed Mobility: Supine to Sit, Sit to Supine ?  ?  ?Supine to sit: Total assist, +2 for physical assistance, +2 for safety/equipment ?Sit to supine: Total assist, +2 for physical assistance, +2 for safety/equipment ?  ?General bed mobility comments: pt will not initiate movement, has poor control of LE's with no movement on command ?  ? ?Transfers ?Overall transfer level: Needs assistance ?  ?  ?  ?  ?  ?  ?  ?  ?General transfer comment: unable to attempt standing due to lethargy and lack of active control of LE's ?  ? ?Ambulation/Gait ?  ?  ?  ?  ?  ?  ?  ?General Gait Details: unable to stand ? ?Stairs ?  ?  ?  ?  ?  ? ?Wheelchair Mobility ?  ? ?Modified Rankin (Stroke Patients Only) ?Modified Rankin (Stroke Patients Only) ?Pre-Morbid Rankin Score: Severe disability ?Modified Rankin: Severe disability ? ?  ? ?Balance Overall balance assessment: Needs assistance ?Sitting-balance support: Single extremity supported ?Sitting balance-Leahy Scale: Poor ?Sitting balance - Comments: balance is managed by pt with LUE or by therapists ?Postural control: Left lateral lean, Posterior lean ?  ?  ?  ?  ?  ?  ?  ?  ?  ?  ?  ?  ?  ?  ?   ? ? ? ?Pertinent Vitals/Pain Pain Assessment ?Pain Assessment: Faces ?Faces Pain Scale: No hurt  ? ? ?Home Living Family/patient expects to be discharged to:: Skilled nursing facility ?  ?  ?  ?  ?  ?  ?  ?  ?  ?Additional Comments: memory unit  ?  ?Prior Function Prior Level of Function :  Needs assist;Patient poor historian/Family not available;Other (comment) (wheelchair level mobility per chart) ?  ?  ?  ?Physical Assist : Mobility (physical) ?Mobility (physical): Bed mobility;Transfers ?  ?Mobility Comments: bed to chair but chart and pt cannot deliver PLOF ?ADLs Comments: per chart assist for ADLs but able to self feed ?  ? ? ?Hand Dominance  ?   ? ?  ?Extremity/Trunk  Assessment  ? Upper Extremity Assessment ?Upper Extremity Assessment: Defer to OT evaluation ?RUE Deficits / Details: generalized weakness, flexion tone/tigtness noted in elbow, hand ?RUE Coordination: decreased fine motor;decreased gross motor ?LUE Deficits / Details: generalized weakness, able to squeeze hand and control descend of arm after elevated ?LUE Coordination: decreased fine motor;decreased gross motor ?  ? ?Lower Extremity Assessment ?Lower Extremity Assessment: Difficult to assess due to impaired cognition;Generalized weakness ?  ? ?Cervical / Trunk Assessment ?Cervical / Trunk Assessment: Kyphotic (mild)  ?Communication  ? Communication: Receptive difficulties;Expressive difficulties;Other (comment) (limited verbalizations)  ?Cognition Arousal/Alertness: Lethargic ?Behavior During Therapy: Flat affect ?Overall Cognitive Status: History of cognitive impairments - at baseline ?  ?  ?  ?  ?  ?  ?  ?  ?  ?  ?  ?  ?  ?  ?  ?  ?General Comments: dementia baseline, slow to follow instructions with both verbal and tactile cues needed, eventually could assist to support balance ?  ?  ? ?  ?General Comments General comments (skin integrity, edema, etc.): pt had normal HR and sats with movement ? ?  ?Exercises    ? ?Assessment/Plan  ?  ?PT Assessment Patient needs continued PT services  ?PT Problem List Decreased strength;Decreased range of motion;Decreased activity tolerance;Decreased balance;Decreased mobility;Decreased cognition ? ?   ?  ?PT Treatment Interventions DME instruction;Functional mobility training;Therapeutic activities;Therapeutic exercise;Balance training;Neuromuscular re-education;Patient/family education   ? ?PT Goals (Current goals can be found in the Care Plan section)  ?Acute Rehab PT Goals ?Patient Stated Goal: NA ?PT Goal Formulation: Patient unable to participate in goal setting ?Time For Goal Achievement: 03/27/22 ?Potential to Achieve Goals: Fair ? ?  ?Frequency Min 2X/week ?   ? ? ?Co-evaluation   ?  ?  ?  ?  ? ? ?  ?AM-PAC PT "6 Clicks" Mobility  ?Outcome Measure Help needed turning from your back to your side while in a flat bed without using bedrails?: Total ?Help needed moving from lying on your back to sitting on the side of a flat bed without using bedrails?: Total ?Help needed moving to and from a bed to a chair (including a wheelchair)?: Total ?Help needed standing up from a chair using your arms (e.g., wheelchair or bedside chair)?: Total ?Help needed to walk in hospital room?: Total ?Help needed climbing 3-5 steps with a railing? : Total ?6 Click Score: 6 ? ?  ?End of Session   ?Activity Tolerance: Patient limited by lethargy;Treatment limited secondary to medical complications (Comment) ?Patient left: in bed;with call bell/phone within reach ?Nurse Communication: Mobility status ?PT Visit Diagnosis: Muscle weakness (generalized) (M62.81);Other symptoms and signs involving the nervous system (R29.898);Difficulty in walking, not elsewhere classified (R26.2);Hemiplegia and hemiparesis ?Hemiplegia - Right/Left: Left ?Hemiplegia - dominant/non-dominant: Non-dominant ?Hemiplegia - caused by: Cerebral infarction ?  ? ?Time: 4098-1191 ?PT Time Calculation (min) (ACUTE ONLY): 13 min ? ? ?Charges:   PT Evaluation ?$PT Eval Moderate Complexity: 1 Mod ?  ?  ?   ? ?Ivar Drape ?03/13/2022, 11:37 AM ? ?Samul Dada, PT PhD ?Acute Rehab Dept. Number: [facility]  [phone] and [facility] [phone] ? ?

## 2022-03-13 NOTE — Progress Notes (Signed)
Carotid duplex bilateral study completed.   Please see CV Proc for preliminary results.   Zamarion Longest, RDMS, RVT  

## 2022-03-13 NOTE — ED Notes (Signed)
MD at bedside. 

## 2022-03-13 NOTE — Progress Notes (Signed)
?PROGRESS NOTE ? ? ? ?Tammie Medicusnn Lehan  [id number] DOB: 1936/08/11 DOA: 03/12/2022 ?PCP: Ashley Royaltyhigpen, Jacqueline R, NP ? ? ?Brief Narrative:  ?Tammie Wells is a 86 y.o. female with medical history significant of advanced vascular dementia, expressive aphasia, bedbound status who can feed herself "finger foods" who presents from facility for "confusion".  After further discussion with POA it appears patient's mental status changes were more acutely described as a lethargy, nonresponsive and inability to focus or localize on people or objects.  She has been unable to cooperate with staff on taking medications or taking p.o. ? ?Assessment & Plan: ?  ?Principal Problem: ?  Acute metabolic encephalopathy ?Active Problems: ?  CVA (cerebral vascular accident) (HCC) ?  Hypokalemia ?  Essential hypertension ?  Dementia with behavioral disturbance (HCC) ? ? ?Subacute CVA (cerebral vascular accident) (HCC) ?-Neurology following, appreciate insight and recommendations  ?-Occipital stroke consistent with patient's visual changes per POA is consistent ?-Patient's POA also indicates patient has moments of unresponsiveness followed by confusion concerning for underlying seizure-like activity although this has never been diagnosed formally.  Unclear at her age with her mental status if she would tolerate seizure medications, again defer to neurology (initial EEG appears negative) ?-Imaging as below remarkable for acute/subacute ischemic occipital stroke ?-Remainder of work-up including echocardiogram carotid duplex ultrasound pending ?-Permissive hypertension per protocol, will reinstate patient's hypertensive medications in the next 24 to 48 hours per neurology ? ?Dementia with behavioral disturbance (HCC) ?Acute metabolic encephalopathy, likely secondary to above, rule out subclinical seizure ?-Defer to neurology, certainly concern for polypharmacy, infection and other more common etiologies can be followed however at this time labs and  micro appear to be wholly unremarkable other than hypokalemia and electrolyte abnormalities in the setting of poor p.o. intake ?-Certainly sundowning versus delirium is reasonable given patient's advanced dementia with behavioral disturbances and underlying anxiety and depression per POA ?-Continue supportive care, otherwise medication holiday as indicated ? ?Hypokalemia, resolved ?-Improved, continue to advance diet as tolerated ? ?Essential hypertension ?- Allow permissive HTN ?-Restart home medications once permissive hypertension window has closed ? ?DVT prophylaxis: SCDs ?Code Status: DNR ?Family Communication: POA updated over the phone ? ?Status is: Inpatient ? ?Dispo: The patient is from: Facility ?             Anticipated d/c is to: Same ?             Anticipated d/c date is: 24 to 48 hours ?             Patient currently not medically stable for discharge given ongoing need for further evaluation and work-up with neurology in the setting of acute stroke ? ?Consultants:  ?Neurology ? ?Procedures:  ?None ? ?Antimicrobials:  ?None indicated ? ?Subjective: ?No acute issues or events overnight ? ?Objective: ?Vitals:  ? 03/12/22 2000 03/13/22 0000 03/13/22 [id number] 03/13/22 0521  ?BP: 138/85 (!) 142/74 136/74 (!) 185/112  ?Pulse: 76 80 76 (!) 112  ?Resp: 18 16 16 17   ?Temp:    98.3 ?F (36.8 ?C)  ?TempSrc:    Oral  ?SpO2: 99% 100% 96% 98%  ?Weight:      ? ? ?Intake/Output Summary (Last 24 hours) at 03/13/2022 0806 ?Last data filed at 03/12/2022 1527 ?Gross per 24 hour  ?Intake 100.08 ml  ?Output --  ?Net 100.08 ml  ? ?Filed Weights  ? 03/12/22 1200  ?Weight: 54 kg  ? ? ?Examination: ? ?General:  Pleasantly resting in bed, No acute distress.  Minimally responsive  but able to repeat short phrases as instructed ?Lungs:  Clear to auscultate bilaterally without rhonchi, wheeze, or rales. ?Heart:  Regular rate and rhythm.  Without murmurs, rubs, or gallops. ?Extremities: Without cyanosis, clubbing, edema ?Skin:  Warm and dry,  no erythema ? ?Data Reviewed: I have personally reviewed following labs and imaging studies ? ?CBC: ?Recent Labs  ?Lab 03/12/22 ?1247 03/12/22 ?1357  ?WBC 5.0  --   ?NEUTROABS 3.3  --   ?HGB 12.4 11.9*  ?HCT 38.1 35.0*  ?MCV 90.1  --   ?PLT 168  --   ? ?Basic Metabolic Panel: ?Recent Labs  ?Lab 03/12/22 ?1247 03/12/22 ?1357 03/13/22 ?6384  ?NA 144 143 139  ?K 2.7* 3.1* 3.4*  ?CL 111 106 109  ?CO2 28  --  23  ?GLUCOSE 89 88 81  ?BUN 10 12 8   ?CREATININE 0.67 0.60 0.60  ?CALCIUM 8.2*  --  9.1  ?MG 1.9  --   --   ?PHOS  --   --  2.4*  ? ?GFR: ?CrCl cannot be calculated (Unknown ideal weight.). ?Liver Function Tests: ?Recent Labs  ?Lab 03/12/22 ?1247  ?AST 20  ?ALT 12  ?ALKPHOS 49  ?BILITOT 0.6  ?PROT 5.6*  ?ALBUMIN 3.0*  ? ?No results for input(s): LIPASE, AMYLASE in the last 168 hours. ?Recent Labs  ?Lab 03/12/22 ?1720  ?AMMONIA 17  ? ?Coagulation Profile: ?Recent Labs  ?Lab 03/12/22 ?1247  ?INR 1.1  ? ?Cardiac Enzymes: ?Recent Labs  ?Lab 03/13/22 ?05/13/22  ?CKTOTAL 80  ? ?BNP (last 3 results) ?No results for input(s): PROBNP in the last 8760 hours. ?HbA1C: ?Recent Labs  ?  03/13/22 ?05/13/22  ?HGBA1C 5.0  ? ?CBG: ?No results for input(s): GLUCAP in the last 168 hours. ?Lipid Profile: ?Recent Labs  ?  03/13/22 ?05/13/22  ?CHOL 147  ?HDL 39*  ?LDLCALC 93  ?TRIG 77  ?CHOLHDL 3.8  ? ?Thyroid Function Tests: ?Recent Labs  ?  03/12/22 ?1720  ?TSH 1.467  ? ?Anemia Panel: ?No results for input(s): VITAMINB12, FOLATE, FERRITIN, TIBC, IRON, RETICCTPCT in the last 72 hours. ?Sepsis Labs: ?No results for input(s): PROCALCITON, LATICACIDVEN in the last 168 hours. ? ?No results found for this or any previous visit (from the past 240 hour(s)).  ? ? ? ? ? ?Radiology Studies: ?MR BRAIN WO CONTRAST ? ?Result Date: 03/12/2022 ?CLINICAL DATA:  Found approximately 9 a.m. slumped over in wheelchair, confusion, aphasia, difficulty arousing. History of TIA EXAM: MRI HEAD WITHOUT CONTRAST TECHNIQUE: Multiplanar, multiecho pulse sequences of the brain and  surrounding structures were obtained without intravenous contrast. COMPARISON:  Same-day noncontrast CT head FINDINGS: Study could not be completed due to patient cooperation. Motion degraded axial and coronal DWI, and axial FLAIR sequences were obtained. Brain: There is a small focus of elevated DWI signal in the right occipital lobe periventricular white matter seen on the coronal DWI sequence suspicious for acute to early subacute infarct (9-42). There is no other evidence of acute infarct. There is no large intraparenchymal hemorrhage. There is mild-to-moderate global parenchymal volume loss with prominence of the ventricular system and extra-axial CSF spaces. The ventricles are similar in size compared to the CT from 2020. There is extensive confluent FLAIR signal abnormality throughout the subcortical and periventricular white matter likely reflecting sequela of advanced chronic white matter microangiopathy. There are remote lacunar infarcts in the bilateral thalami and left lentiform nucleus. There is no large mass lesion.  There is no midline shift. Vascular: Not well assessed. Skull and upper  cervical spine: Not assessed. Sinuses/Orbits: Paranasal sinuses appear grossly clear. The globes and orbits are not well assessed. Other: None. IMPRESSION: 1. Incomplete study due to patient cooperation as above. 2. Probable small acute to early subacute infarct in the right occipital lobe periventricular white matter. No other evidence of acute intracranial pathology. 3. Global parenchymal volume loss and advanced chronic white matter microangiopathy. Electronically Signed   By: Lesia Hausen M.D.   On: 03/12/2022 17:52  ? ?DG Abd Acute W/Chest ? ?Result Date: 03/12/2022 ?CLINICAL DATA:  Altered mental status. EXAM: DG ABDOMEN ACUTE WITH 1 VIEW CHEST COMPARISON:  None. FINDINGS: There is no evidence of dilated bowel loops or free intraperitoneal air. No radiopaque calculi or other significant radiographic abnormality is  seen. Heart size and mediastinal contours are within normal limits. Both lungs are clear. Marked severity calcification of the thoracic aorta is noted. IMPRESSION: Negative abdominal radiographs.  No ac

## 2022-03-13 NOTE — Evaluation (Signed)
Clinical/Bedside Swallow Evaluation ?Patient Details  ?Name: Tammie Wells ?MRN: [id number] ?Date of Birth: June 29, 1936 ? ?Today's Date: 03/13/2022 ?Time: SLP Start Time (ACUTE ONLY): 1031 SLP Stop Time (ACUTE ONLY): 1045 ?SLP Time Calculation (min) (ACUTE ONLY): 14 min ? ?Past Medical History: History reviewed. No pertinent past medical history. ?Past Surgical History: History reviewed. No pertinent surgical history. ?HPI:  ?86 y.o. female presented from facility with worsening confusion and episode of aphasia. Dx small right occipital CVA.  PMHx advanced dementia, bedbound at facility.  ?  ?Assessment / Plan / Recommendation  ?Clinical Impression ? Tammie Wells was lethargic, maintained eyes closed, vocalized on occasion in response to questions.  She did not follow commands and oral mechanism exam was unable to be completed. She demonstrated no anticipation of bolus arrival, did not actively receive edge of cup or spoon, but when material (ice, applesauce) was placed on tongue, she chewed/manipulated and triggered a pharyngeal swallow.  She eventually took several sips of water from a straw with no s/s of aspiration. Recommend initiating a full liquid diet - please continue efforts to feed. She may benefit from a few ice chips to "warm up" prior to offering water/purees.  D/W RN. SLP will follow for diet progression/safety. ?SLP Visit Diagnosis: Dysphagia, unspecified (R13.10) ?   ?Aspiration Risk ? Mild aspiration risk  ?  ?Diet Recommendation   Full liquids ? ?Medication Administration: Whole meds with puree  ?  ?Other  Recommendations Oral Care Recommendations: Oral care BID   ? ?Recommendations for follow up therapy are one component of a multi-disciplinary discharge planning process, led by the attending physician.  Recommendations may be updated based on patient status, additional functional criteria and insurance authorization. ? ?Follow up Recommendations Other (comment) (tba)  ? ? ?  ?Assistance Recommended at  Discharge Frequent or constant Supervision/Assistance  ?Functional Status Assessment Patient has had a recent decline in their functional status and demonstrates the ability to make significant improvements in function in a reasonable and predictable amount of time.  ?Frequency and Duration min 2x/week  ?2 weeks ?  ?   ? ?Prognosis Prognosis for Safe Diet Advancement: Fair ?Barriers to Reach Goals: Cognitive deficits  ? ?  ? ?Swallow Study   ?General Date of Onset: 03/12/22 ?HPI: 86 y.o. female presented from facility with worsening confusion and episode of aphasia. Dx small right occipital CVA.  PMHx advanced dementia, bedbound at facility. ?Type of Study: Bedside Swallow Evaluation ?Previous Swallow Assessment: none per records ?Diet Prior to this Study: NPO ?Temperature Spikes Noted: No ?Respiratory Status: Room air ?History of Recent Intubation: No ?Behavior/Cognition: Lethargic/Drowsy ?Oral Cavity Assessment:  (unable to assess) ?Oral Care Completed by SLP: No ?Oral Cavity - Dentition: Other (Comment) (unable to assess) ?Self-Feeding Abilities: Total assist ?Patient Positioning: Upright in bed ?Baseline Vocal Quality: Not observed ?Volitional Cough: Cognitively unable to elicit ?Volitional Swallow: Unable to elicit  ?  ?Oral/Motor/Sensory Function Overall Oral Motor/Sensory Function: Other (comment) (unable to assess)   ?Ice Chips Ice chips: Impaired ?Presentation: Spoon ?Oral Phase Impairments: Other (comment) (poor anticipation of bolus arrival, but when placed on tongue she actively masticated) ?Oral Phase Functional Implications: Right anterior spillage;Left anterior spillage   ?Thin Liquid Thin Liquid: Impaired ?Presentation: Cup;Spoon;Straw ?Oral Phase Impairments: Reduced labial seal;Poor awareness of bolus  ?  ?Nectar Thick Nectar Thick Liquid: Not tested   ?Honey Thick Honey Thick Liquid: Not tested   ?Puree Puree: Impaired ?Oral Phase Impairments: Poor awareness of bolus   ?Solid ? ? ?  Solid:  Not  tested  ? ?  ? ?Blenda Mounts Laurice ?03/13/2022,10:58 AM ? ?Chayce Rullo L. Woodie Degraffenreid, MA CCC/SLP ?Acute Rehabilitation Services ?Office number [phone] ?Pager [phone] ? ? ? ?

## 2022-03-13 NOTE — Progress Notes (Addendum)
STROKE TEAM PROGRESS NOTE  ? ?INTERVAL HISTORY ?No family is at the bedside patient is lying in bed and does not open her eyes to command.  She is able to say her first name, however she does not answer any other questions or follow simple commands.  She has generalized weakness but does appear to move her bilateral upper extremities equally.  Mild withdraw to pain on bilateral lower extremities however no spontaneous or purposeful movement.  According to chart review she is bedbound at baseline, but able to feed herself finger foods. ? ?Vitals:  ? 03/13/22 0000 03/13/22 0213 03/13/22 0521 03/13/22 0848  ?BP: (!) 142/74 136/74 (!) 185/112 (!) 181/118  ?Pulse: 80 76 (!) 112 98  ?Resp: 16 16 17 16   ?Temp:   98.3 ?F (36.8 ?C)   ?TempSrc:   Oral   ?SpO2: 100% 96% 98% 95%  ?Weight:      ? ?CBC:  ?Recent Labs  ?Lab 03/12/22 ?1247 03/12/22 ?1357  ?WBC 5.0  --   ?NEUTROABS 3.3  --   ?HGB 12.4 11.9*  ?HCT 38.1 35.0*  ?MCV 90.1  --   ?PLT 168  --   ? ?Basic Metabolic Panel:  ?Recent Labs  ?Lab 03/12/22 ?1247 03/12/22 ?1357 03/13/22 ?[id number]  ?NA 144 143 139  ?K 2.7* 3.1* 3.4*  ?CL 111 106 109  ?CO2 28  --  23  ?GLUCOSE 89 88 81  ?BUN 10 12 8   ?CREATININE 0.67 0.60 0.60  ?CALCIUM 8.2*  --  9.1  ?MG 1.9  --   --   ?PHOS  --   --  2.4*  ? ?Lipid Panel:  ?Recent Labs  ?Lab 03/13/22 ?[id number]  ?CHOL 147  ?TRIG 77  ?HDL 39*  ?CHOLHDL 3.8  ?VLDL 15  ?LDLCALC 93  ? ?HgbA1c:  ?Recent Labs  ?Lab 03/13/22 ?[id number]  ?HGBA1C 5.0  ? ?Urine Drug Screen: No results for input(s): LABOPIA, COCAINSCRNUR, LABBENZ, AMPHETMU, THCU, LABBARB in the last 168 hours.  ?Alcohol Level No results for input(s): ETH in the last 168 hours. ? ?IMAGING past 24 hours ?MR BRAIN WO CONTRAST ? ?Result Date: 03/12/2022 ?CLINICAL DATA:  Found approximately 9 a.m. slumped over in wheelchair, confusion, aphasia, difficulty arousing. History of TIA EXAM: MRI HEAD WITHOUT CONTRAST TECHNIQUE: Multiplanar, multiecho pulse sequences of the brain and surrounding structures were  obtained without intravenous contrast. COMPARISON:  Same-day noncontrast CT head FINDINGS: Study could not be completed due to patient cooperation. Motion degraded axial and coronal DWI, and axial FLAIR sequences were obtained. Brain: There is a small focus of elevated DWI signal in the right occipital lobe periventricular white matter seen on the coronal DWI sequence suspicious for acute to early subacute infarct (9-42). There is no other evidence of acute infarct. There is no large intraparenchymal hemorrhage. There is mild-to-moderate global parenchymal volume loss with prominence of the ventricular system and extra-axial CSF spaces. The ventricles are similar in size compared to the CT from 2020. There is extensive confluent FLAIR signal abnormality throughout the subcortical and periventricular white matter likely reflecting sequela of advanced chronic white matter microangiopathy. There are remote lacunar infarcts in the bilateral thalami and left lentiform nucleus. There is no large mass lesion.  There is no midline shift. Vascular: Not well assessed. Skull and upper cervical spine: Not assessed. Sinuses/Orbits: Paranasal sinuses appear grossly clear. The globes and orbits are not well assessed. Other: None. IMPRESSION: 1. Incomplete study due to patient cooperation as above. 2. Probable small acute to early  subacute infarct in the right occipital lobe periventricular white matter. No other evidence of acute intracranial pathology. 3. Global parenchymal volume loss and advanced chronic white matter microangiopathy. Electronically Signed   By: Lesia Hausen M.D.   On: 03/12/2022 17:52  ? ?DG Abd Acute W/Chest ? ?Result Date: 03/12/2022 ?CLINICAL DATA:  Altered mental status. EXAM: DG ABDOMEN ACUTE WITH 1 VIEW CHEST COMPARISON:  None. FINDINGS: There is no evidence of dilated bowel loops or free intraperitoneal air. No radiopaque calculi or other significant radiographic abnormality is seen. Heart size and  mediastinal contours are within normal limits. Both lungs are clear. Marked severity calcification of the thoracic aorta is noted. IMPRESSION: Negative abdominal radiographs.  No acute cardiopulmonary disease. Electronically Signed   By: Aram Candela M.D.   On: 03/12/2022 19:34  ? ?EEG adult ? ?Result Date: 03/13/2022 ?[name], MD     03/13/2022  8:23 AM Patient Name: Tammie Wells MRN: [id number] Epilepsy Attending: [name] Referring Physician/Provider: Rejeana Brock, MD Date: 03/12/2022 Duration: 23.26 mins Patient history:  86 year old female with advanced dementia with worsening confusion. EEG to evaluate for seizure Level of alertness: Awake, asleep AEDs during EEG study: None Technical aspects: This EEG study was done with scalp electrodes positioned according to the 10-20 International system of electrode placement. Electrical activity was acquired at a sampling rate of 500Hz  and reviewed with a high frequency filter of 70Hz  and a low frequency filter of 1Hz . EEG data were recorded continuously and digitally stored. Description: The posterior dominant rhythm consists of 7 Hz activity of moderate voltage (25-35 uV) seen predominantly in posterior head regions, symmetric and reactive to eye opening and eye closing. Sleep was characterized by sleep spindles (12 to 14 Hz), maximal frontocentral region.  EEG showed continuous generalized 3 to 6 Hz theta-delta slowing. Hyperventilation and photic stimulation were not performed.   ABNORMALITY - Continuous slow, generalized - Background slow IMPRESSION: This study is suggestive of moderate diffuse encephalopathy, nonspecific etiology. No seizures or epileptiform discharges were seen throughout the recording. Priyanka  ? ?CT HEAD CODE STROKE WO CONTRAST ? ?Result Date: 03/12/2022 ?CLINICAL DATA:  Code stroke. Neuro deficit, acute, stroke suspected. EXAM: CT HEAD WITHOUT CONTRAST TECHNIQUE: Contiguous axial images were obtained from the  base of the skull through the vertex without intravenous contrast. RADIATION DOSE REDUCTION: This exam was performed according to the departmental dose-optimization program which includes automated exposure control, adjustment of the mA and/or kV according to patient size and/or use of iterative reconstruction technique. COMPARISON:  06/10/2019 FINDINGS: Brain: No acute finding. Extensive chronic small-vessel ischemic changes throughout the cerebral hemispheric white matter. Old small vessel infarctions of the thalami and basal ganglia. No large vessel territory stroke. No mass, hemorrhage, hydrocephalus or extra-axial collection. Vascular: There is atherosclerotic calcification of the major vessels at the base of the brain. Skull: Negative Sinuses/Orbits: Clear/normal Other: None ASPECTS (Alberta Stroke Program Early CT Score) - Ganglionic level infarction (caudate, lentiform nuclei, internal capsule, insula, M1-M3 cortex): 7 - Supraganglionic infarction (M4-M6 cortex): 3 Total score (0-10 with 10 being normal): 10 IMPRESSION: 1. No acute CT finding. Advanced chronic small-vessel ischemic changes of the thalami, basal ganglia and cerebral hemispheric white matter. 2. ASPECTS is 10. 3. These results were communicated to Dr. Annabelle Harman at 12:17 pm on 03/12/2022 by text page via the [facility] messaging system. Electronically Signed   By: Amada Jupiter M.D.   On: 03/12/2022 12:18   ? ?PHYSICAL EXAM ?General: Frail, elderly caucasian  female ?Neuro: Lying in bed, eyes do not open to voice/command.  She is able to tell me her first name.  Voice is dysarthric, global aphasia. ?She does not follow simple commands. ?Cranial nerves:  ?II, III,IV, VI: With forced eye opening, PERRL, does not track bilaterally, will blink to threat bilaterally ?V: Facial sensation is symmetric to temperature ?VII: Facial movement is symmetric.  ?VIII: hearing is intact to voice ?X: Uvula elevates symmetrically ?XI: Head is midline ?XII: Tongue  protrusion not cooperative ?Not able to raise up BLE, but she does have a slow drift with both forearms ?Mild withdraw to BLEs on pain stimulation.  ?Sensation, coordination and gait not tested. ? ?ASSESSMENT/PLAN ?Tammie Wells

## 2022-03-13 NOTE — ED Notes (Signed)
Patient transported to 3W by transportation ?

## 2022-03-13 NOTE — ED Notes (Signed)
Patient cleaned up placed into new pt gown, new bed sheets placed and new depends placed on patient. Warm blankets given. ?

## 2022-03-13 NOTE — Evaluation (Addendum)
Occupational Therapy Evaluation ?Patient Details ?Name: Tammie Wells ?MRN: [id number] ?DOB: 12/28/1935 ?Today's Date: 03/13/2022 ? ? ?History of Present Illness Pt is a 86 y/o female presenting [facility] with worsening confusion. CT negative, MRI with porbable small acute to early subacute infarct in R occipital lobe.  PMH includes: dementia, HTN, breast CA.  ? ?Clinical Impression ?  ?Patient admitted for above and limited by problem list below.  Limited assessment, pt lethargic during session.  She is oriented to her name but otherwise with limited verbalizations or engagement in session. Requires total assist +2 for bed mobility and total assist for all self care.  Pt initially follows simple commands but fades quickly during session. Pt unable to provide PLOF, but per chart pt self feeds finger foods.  OT will follow acutely to address self feeding once diet is upgraded.   ?   ? ?Recommendations for follow up therapy are one component of a multi-disciplinary discharge planning process, led by the attending physician.  Recommendations may be updated based on patient status, additional functional criteria and insurance authorization.  ? ?Follow Up Recommendations ? Long-term institutional care without follow-up therapy  ?  ?Assistance Recommended at Discharge Frequent or constant Supervision/Assistance  ?Patient can return home with the following Two people to help with walking and/or transfers;Two people to help with bathing/dressing/bathroom;Assistance with feeding;Direct supervision/assist for financial management;Direct supervision/assist for medications management;Assistance with cooking/housework;Assist for transportation;Help with stairs or ramp for entrance ? ?  ?Functional Status Assessment ? Patient has had a recent decline in their functional status and demonstrates the ability to make significant improvements in function in a reasonable and predictable amount of time.   ?Equipment Recommendations ? None recommended by OT  ?  ?Recommendations for Other Services Other (comment) (pallative care consult) ? ? ?  ?Precautions / Restrictions Precautions ?Precautions: Fall ?Restrictions ?Weight Bearing Restrictions: No  ? ?  ? ?Mobility Bed Mobility ?Overal bed mobility: Needs Assistance ?Bed Mobility: Supine to Sit, Sit to Supine ?  ?  ?Supine to sit: Total assist, +2 for physical assistance, +2 for safety/equipment ?Sit to supine: Total assist, +2 for physical assistance, +2 for safety/equipment ?  ?  ?  ? ?Transfers ?  ?  ?  ?  ?  ?  ?  ?  ?  ?  ?  ? ?  ?Balance Overall balance assessment: Needs assistance ?Sitting-balance support: No upper extremity supported, Feet supported, Single extremity supported ?Sitting balance-Leahy Scale: Fair ?Sitting balance - Comments: at best min guard, preference to L UE support; up to mod assist at times ?Postural control: Left lateral lean ?  ?  ?  ?  ?  ?  ?  ?  ?  ?  ?  ?  ?  ?  ?   ? ?ADL either performed or assessed with clinical judgement  ? ?ADL Overall ADL's : Needs assistance/impaired ?Eating/Feeding: NPO ?  ?Grooming: Therapist, nutritionalWash/dry face;Total assistance ?  ?  ?  ?  ?  ?  ?  ?  ?  ?  ?  ?  ?  ?  ?  ?Functional mobility during ADLs: Total assistance;+2 for physical assistance;+2 for safety/equipment ?General ADL Comments: total assist for all self care  ? ? ? ?Vision   ?Additional Comments: pt keeps eyes closed during session, unable to asess.  Per chart, neuology reports R visual deficits- continue assessment.  ?   ?Perception   ?  ?Praxis   ?  ? ?  Pertinent Vitals/Pain Pain Assessment ?Pain Assessment: Faces ?Faces Pain Scale: No hurt  ? ? ? ?Hand Dominance   ?  ?Extremity/Trunk Assessment Upper Extremity Assessment ?Upper Extremity Assessment: RUE deficits/detail;LUE deficits/detail ?RUE Deficits / Details: generalized weakness, flexion tone/tigtness noted in elbow, hand ?RUE Coordination: decreased fine motor;decreased gross motor ?LUE Deficits /  Details: generalized weakness, able to squeeze hand and control descend of arm after elevated ?LUE Coordination: decreased fine motor;decreased gross motor ?  ?Lower Extremity Assessment ?Lower Extremity Assessment: Defer to PT evaluation ?  ?  ?  ?Communication Communication ?Communication: Expressive difficulties;Receptive difficulties ?  ?Cognition Arousal/Alertness: Lethargic ?Behavior During Therapy: Flat affect ?Overall Cognitive Status: History of cognitive impairments - at baseline ?  ?  ?  ?  ?  ?  ?  ?  ?  ?  ?  ?  ?  ?  ?  ?  ?General Comments: hx of cognitive deficits, oriented to name but otherwise limited verbalizations during session.  Initally follows 1 step commands but fades quickly with fatigue.  Keeps eyes closed during session. ?  ?  ?General Comments  VSS ? ?  ?Exercises   ?  ?Shoulder Instructions    ? ? ?Home Living Family/patient expects to be discharged to:: Skilled nursing facility ?  ?  ?  ?  ?  ?  ?  ?  ?  ?  ?  ?  ?  ?  ?  ?  ?Additional Comments: memory care per chart review ?  ? ?  ?Prior Functioning/Environment Prior Level of Function : Needs assist;Patient poor historian/Family not available ?  ?  ?  ?  ?  ?  ?Mobility Comments: bedbound per chart review, up in w/c but unsure if hoyer vs pivot transfer? ?ADLs Comments: per chart assist for ADLs but able to self feed ?  ? ?  ?  ?OT Problem List: Decreased strength;Decreased range of motion;Decreased coordination;Impaired balance (sitting and/or standing);Impaired vision/perception;Impaired UE functional use ?  ?   ?OT Treatment/Interventions: Self-care/ADL training;Visual/perceptual remediation/compensation;Patient/family education  ?  ?OT Goals(Current goals can be found in the care plan section) Acute Rehab OT Goals ?Patient Stated Goal: none stated ?OT Goal Formulation: Patient unable to participate in goal setting ?Time For Goal Achievement: 03/27/22 ?Potential to Achieve Goals: Fair  ?OT Frequency: Min 2X/week ?   ? ?Co-evaluation   ?  ?  ?  ?  ? ?  ?AM-PAC OT "6 Clicks" Daily Activity     ?Outcome Measure Help from another person eating meals?: Total ?Help from another person taking care of personal grooming?: Total ?Help from another person toileting, which includes using toliet, bedpan, or urinal?: Total ?Help from another person bathing (including washing, rinsing, drying)?: Total ?Help from another person to put on and taking off regular upper body clothing?: Total ?Help from another person to put on and taking off regular lower body clothing?: Total ?6 Click Score: 6 ?  ?End of Session Nurse Communication: Mobility status ? ?Activity Tolerance: Patient limited by lethargy;Patient limited by fatigue ?Patient left: in bed;with call bell/phone within reach ? ?OT Visit Diagnosis: Muscle weakness (generalized) (M62.81);Other symptoms and signs involving cognitive function  ?              ?Time: 7564-3329 ?OT Time Calculation (min): 14 min ?Charges:  OT General Charges ?$OT Visit: 1 Visit ?OT Evaluation ?$OT Eval Moderate Complexity: 1 Mod ? ?Barry Brunner, OT ?Acute Rehabilitation Services ?Pager [phone] ?Office [phone] ? ? ?Chancy Milroy ?  03/13/2022, 10:23 AM ?

## 2022-03-14 DIAGNOSIS — Z515 Encounter for palliative care: Secondary | ICD-10-CM

## 2022-03-14 MED ORDER — K PHOS MONO-SOD PHOS DI & MONO 155-852-130 MG PO TABS
250.0000 mg | ORAL_TABLET | Freq: Two times a day (BID) | ORAL | Status: AC
  Administered 2022-03-14 (×2): 250 mg via ORAL
  Filled 2022-03-14 (×2): qty 1

## 2022-03-14 NOTE — Evaluation (Signed)
SLP Cancellation Note ? ?Patient Details ?Name: Tammie Wells ?MRN: [id number] ?DOB: 11/06/1936 ? ? ?Cancelled treatment:       Reason Eval/Treat Not Completed: Other (comment) (pt not alert enought for eval or po) 2nd attempt to see pt, did not awaken adequately for po or SLE with sternal rub and verbal cues.  Will continue efforts.  ? ?Kathleen Lime, MS [facility] SLP ?Acute Rehab Services ?Office [phone] ?Pager [phone] ? ? ? ?Macario Golds ?03/14/2022, 8:54 AM ? ? ? ?

## 2022-03-14 NOTE — Progress Notes (Signed)
?  Transition of Care (TOC) Screening Note ? ? ?Patient Details  ?Name: Tammie Wells ?Date of Birth: June 27, 1936 ? ? ?Transition of Care [facility]) CM/SW Contact:    ?Baldemar Lenis, LCSW ?Phone Number: ?03/14/2022, 3:16 PM ? ? ? ?Transition of Care Department [facility]) has reviewed patient and no TOC needs have been identified at this time; awaiting palliative consult for goals of care for disposition. We will continue to monitor patient advancement through interdisciplinary progression rounds. If new patient transition needs arise, please place a TOC consult. ?  ?

## 2022-03-14 NOTE — Care Plan (Signed)
Called to daughter, no answer, left VM. 

## 2022-03-14 NOTE — Progress Notes (Signed)
?  Progress Note ? ? ?Patient: Tammie Wells ZWC:[id number] DOB: 07-20-1936 DOA: 03/12/2022     1 ?DOS: the patient was seen and examined on 03/14/2022 at 9:27AM ?  ? ? ? ?Brief hospital course: ?Mrs. Cutrone is an 86 y.o. F with hx cerebrovascular disease, vascular dementia, bedbound at baseline, history BrCA who presented with decreased mentation.   ? ?MRI showed a probable small acute to early subacute infarct in the right occipital lobe periventricular white matter.  See prior summary. ? ? ? ? ?Assessment and Plan: ?* Acute metabolic encephalopathy ?Due to stroke in setting of dementia.  No signs of infection. ? ?CVA (cerebral vascular accident) ([place]) ?Admitted and MRI brain showed smal right occipital periventricular white matter stroke.   ? ?EEG normal.  Echocardiogram showed no cardiogenic source of embolism.  Carotid imaging unremarkable. ? ?-Lipids ordered: LDL 93 continue Lipitor, add Zetia ?-Aspirin ordered at admission --> continue aspirin and Plavix ?-Atrial fibrillation: Not present on tele ?-tPA not given because unknown time frame ?-Dysphagia screen ordered in ER ?-PT eval ordered: recommending SNF vs HH ?-Smoking cessation: not pertinent  ? ?Hypophosphatemia ?- Supplement Phos ? ?Essential hypertension ?BP elevated ?- Continue lisinopril, metoprolol ? ?Hypokalemia ?- Supplement K ? ?Dementia with behavioral disturbance ([place]) ?Lives in ALF ? ? ? ? ? ? ? ? ? ?Subjective: Patient sleepy, does not wake up much to interact, shakes her head no concerns.  Nursing report no fever, respiratory distress, vomiting, seizures, agitation. ? ? ? ? ?Physical Exam: ?Vitals:  ? 03/13/22 2324 03/14/22 0337 03/14/22 0802 03/14/22 1100  ?BP: (!) 135/91 (!) 157/99 121/60 139/81  ?Pulse: (!) 105 (!) 110 85 93  ?Resp: _0 ?Temp: 98.1 ?F (36.7 ?C) 98.9 ?F (37.2 ?C)  98.2 ?F (36.8 ?C)  ?TempSrc: Oral Oral Oral Oral  ?SpO2: 96% 97% 97% 98%  ?Weight:      ? ?Small elderly adult female, lying in bed, sleeping.  Stirs to noxious  stimuli, shakes her head now, falls back asleep. ?Respiratory effort normal, lungs clear without rales or wheezes ?RRR, no murmurs, no peripheral edema ?Abdomen without grimace to palpation, no guarding or rigidity ?All 4 extremities seem contractured although I think this is mostly because she is not following commands, she resists in all 4 extremities.  Maybe a little bit weak in the left arm.  Voice is fluent but speech is sparse and she mostly keeps her eyes closed and does not answer many questions. ? ?Data Reviewed: ?Neurology notes reviewed, vital signs reviewed, nursing notes reviewed. ?Labs notable for mild hypophosphatemia and hypokalemia ?MRI notable for small right periventricular infarct ?Carotids normal ?LDL 93 ?EEG normal ?Creatinine normal ?Complete blood count unremarkable ?CK normal ?Urinalysis without bacteriuria. ?Echocardiogram unremarkable, grade 1 diastolic dysfunction ?Hemoglobin A1c normal ? ?Family Communication:   ? ? ? ?Disposition: ?Status is: Inpatient ? ? ? ? ? ? ? ? ?Author: ?Edwin Dada, MD ?03/14/2022 3:13 PM ? ?For on call review [email or web address].  ? ? ?

## 2022-03-14 NOTE — Assessment & Plan Note (Signed)
Resolved with supplementation and starting spironolactone. 

## 2022-03-14 NOTE — Assessment & Plan Note (Addendum)
Admitted and MRI brain showed smal right occipital periventricular white matter stroke.   ? ?EEG normal.  Echocardiogram showed no cardiogenic source of embolism.  Carotid imaging unremarkable. ? ?-Lipids ordered: LDL 93 continue Lipitor, add Zetia ?-Aspirin ordered at admission --> continue aspirin and Plavix ?-Atrial fibrillation: Not present on tele ?-tPA not given because unknown time frame ?-Dysphagia screen ordered in ER ?-PT eval ordered: recommending SNF vs HH ?-Smoking cessation: not pertinent  ?

## 2022-03-14 NOTE — Assessment & Plan Note (Signed)
BP elevated ?- Continue lisinopril, metoprolol ?

## 2022-03-14 NOTE — Consult Note (Signed)
? ?                                                                                ?Consultation Note ?Date: 03/14/2022  ? ?Patient Name: Tammie Wells  ?DOB: [birth year]  MRN: [id number]  Age / Sex: 86 y.o., female  ?PCP: Edmonia Caprio, NP ?Referring Physician: Edwin Dada, * ? ?Reason for Consultation: Establishing goals of care and Psychosocial/spiritual support ? ?HPI/Patient Profile: 86 y.o. female   admitted on 03/12/2022 from [facility] memory care with past medical history significant for dementia, hypertension, breast cancer.  She was admitted from facility with worsening confusion.  CT negative, MRI with probable small acute early subacute infarct in right occipital lobe. ? ?Today patient is minimally respond, Of nonverbal and unable to follow commands. ? ?Family face treatment option decisions, advanced directive decisions and anticipatory care needs. ? ? ? ?Clinical Assessment and Goals of Care: ? ? ?This NP Wadie Lessen reviewed medical records, received report from team, assessed the patient and then spoke by phone with HPOA/ Henry Russel   to discuss diagnosis, prognosis, [place], EOL wishes disposition and options. ?  ?Concept of Palliative Care was introduced as specialized medical care for people and their families living with serious illness.  If focuses on providing relief from the symptoms and stress of a serious illness.  The goal is to improve quality of life for both the patient and the family.  ? ?Values and goals of care important to patient and family were attempted to be elicited. ?  ?Education offered today regarding advanced directives.  Concepts specific to code status, artifical feeding and hydration, continued IV antibiotics and rehospitalization was had.  Education offered regarding the difference between a aggressive medical intervention path  and a palliative comfort care path for this patient at this time was had.   ?  ?Natural trajectory and expectations at EOL  were discussed.  Questions and concerns addressed.  Patient  encouraged to call with questions or concerns.   ?  ?PMT will continue to support holistically. ?  ? ?No advance care planning documents or H POA document noted in electronic medical record. ?[name] tells me she is the age [place] and will bring documents in tomorrow for scanning. ?  ?  ? ?SUMMARY OF RECOMMENDATIONS   ? ?Code Status/Advance Care Planning: ?DNR ? ? ?Palliative Prophylaxis:  ?Aspiration, Bowel Regimen, Delirium Protocol, Frequent Pain Assessment, and Oral Care ? ?Additional Recommendations (Limitations, Scope, Preferences): ?Full Scope Treatment ?Continue current medical interventions, family will make further decisions regarding treatment plan when they meet in person with palliative medicine tomorrow morning at  ?09:30 ? ?Psycho-social/Spiritual:  ?Desire for further Chaplaincy support:no ?Additional Recommendations: Education on Hospice ? ?Prognosis:  ?Unable to determine-will depend on decisions regarding life prolonging measures ? ?Discharge Planning: To Be Determined  ? ?  ? ?Primary Diagnoses: ?Present on Admission: ? Dementia with behavioral disturbance ([place]) ? Hypokalemia ? CVA (cerebral vascular accident) [facility]) ? Acute metabolic encephalopathy ? Essential hypertension ? Acute ischemic stroke ([place]) ? ? ?I have reviewed the medical record, interviewed the patient and family, and examined the patient. The following aspects are pertinent. ? ?History reviewed.  No pertinent past medical history. ?Social History  ? ?Socioeconomic History  ? Marital status: Unknown  ?  Spouse name: Not on file  ? Number of children: Not on file  ? Years of education: Not on file  ? Highest education level: Not on file  ?Occupational History  ? Not on file  ?Tobacco Use  ? Smoking status: Not on file  ? Smokeless tobacco: Not on file  ?Substance and Sexual Activity  ? Alcohol use: Not on file  ? Drug use: Not on file  ? Sexual activity: Not on file  ?Other  Topics Concern  ? Not on file  ?Social History Narrative  ? Not on file  ? ?Social Determinants of Health  ? ?Financial Resource Strain: Not on file  ?Food Insecurity: Not on file  ?Transportation Needs: Not on file  ?Physical Activity: Not on file  ?Stress: Not on file  ?Social Connections: Not on file  ? ?History reviewed. No pertinent family history. ?Scheduled Meds: ?  stroke: early stages of recovery book   Does not apply Once  ? aspirin EC  81 mg Oral Daily  ? atorvastatin  80 mg Oral Daily  ? clopidogrel  75 mg Oral Daily  ? enoxaparin (LOVENOX) injection  30 mg Subcutaneous Q24H  ? ezetimibe  10 mg Oral Daily  ? lisinopril  20 mg Oral Daily  ? metoprolol succinate  25 mg Oral Daily  ? phosphorus  250 mg Oral BID  ? sertraline  50 mg Oral Daily  ? ?Continuous Infusions: ? sodium chloride Stopped (03/14/22 0139)  ? ?PRN Meds:.acetaminophen **OR** acetaminophen (TYLENOL) oral liquid 160 mg/5 mL **OR** acetaminophen, senna-docusate ?Medications Prior to Admission:  ?Prior to Admission medications   ?Medication Sig Start Date End Date Taking? Authorizing Provider  ?aspirin 81 MG EC tablet Take 81 mg by mouth daily.   Yes [provider]  ?atorvastatin (LIPITOR) 80 MG tablet Take 80 mg by mouth daily. 01/22/22  Yes [provider]  ?gabapentin (NEURONTIN) 100 MG capsule Take 100 mg by mouth 2 (two) times daily. 02/24/22  Yes [provider]  ?lisinopril (ZESTRIL) 20 MG tablet Take 20 mg by mouth daily. 01/22/22  Yes [provider]  ?metoprolol succinate (TOPROL-XL) 25 MG 24 hr tablet Take 25 mg by mouth daily. 01/22/22  Yes [provider]  ?sertraline (ZOLOFT) 50 MG tablet Take 50 mg by mouth daily. 01/22/22  Yes [provider]  ?Vitamin D, Ergocalciferol, (DRISDOL) 1.25 MG (50000 UNIT) CAPS capsule Take 50,000 Units by mouth once a week. 12/19/21  Yes [provider]  ? ?Allergies  ?Allergen Reactions  ? Latex Rash  ? Sulfa Antibiotics Other (See  Comments)  ?  Unknown. ?Hospitalized when pt. Was a child ?  ? ?Review of Systems  ?Unable to perform ROS: Dementia  ? ?Physical Exam ?Constitutional:   ?   Comments: Minimally responsive   ?Cardiovascular:  ?   Rate and Rhythm: Normal rate.  ?Pulmonary:  ?   Effort: Pulmonary effort is normal.  ?Skin: ?   General: Skin is warm and dry.  ? ? ?Vital Signs: BP 121/60 (BP Location: Right Arm)   Pulse 85   Temp 98.9 ?F (37.2 ?C) (Oral)   Resp 17   Wt 54 kg   SpO2 97%  ?Pain Scale: 0-10 ?  ?Pain Score: 0-No pain ? ? ?SpO2: SpO2: 97 % ?O2 Device:SpO2: 97 % ?O2 Flow Rate: .  ? ?IO: Intake/output summary:  ?Intake/Output Summary (  Last 24 hours) at 03/14/2022 0946 ?Last data filed at 03/13/2022 1934 ?Gross per 24 hour  ?Intake 304.38 ml  ?Output 100 ml  ?Net 204.38 ml  ? ? ?LBM: Last BM Date : 03/13/22 ?Baseline Weight: Weight: 54 kg ?Most recent weight: Weight: 54 kg     ?Palliative Assessment/Data:  30 % at best ? ? ? ?Discussed with Dr Loleta Books via secure chat ?Signed by: ?Wadie Lessen, NP ?  ?Please contact Palliative Medicine Team phone at [phone] for questions and concerns.  ?For individual provider: See Amion ? ? ? ? ? ? ? ? ? ? ? ? ? ?

## 2022-03-14 NOTE — Assessment & Plan Note (Signed)
Lives in ALF 

## 2022-03-14 NOTE — Evaluation (Signed)
SLP Cancellation Note ? ?Patient Details ?Name: Tammie Wells ?MRN: [id number] ?DOB: 06-26-1936 ? ? ?Cancelled treatment:       Reason Eval/Treat Not Completed: Other (comment) (pt not alert enought for eval or po) ?Rolena Infante, MS CCC SLP ?Acute Rehab Services ?Office [phone] ?Pager [phone] ? ? ?Chales Abrahams ?03/14/2022, 7:37 AM ? ? ? ?

## 2022-03-14 NOTE — Assessment & Plan Note (Signed)
-   Supplement Phos 

## 2022-03-14 NOTE — Assessment & Plan Note (Signed)
Due to stroke in setting of dementia.  No signs of infection. ?

## 2022-03-14 NOTE — Hospital Course (Addendum)
Tammie Wells is an 86 y.o. F with hx cerebrovascular disease, vascular dementia, bedbound at baseline, history BrCA who presented with decreased mentation.   ? ?MRI showed a probable small acute to early subacute infarct in the right occipital lobe periventricular white matter.    ? ?The patient's mentation remained poor, and she refused all oral intake.  Family discussed goals of care with MD and Palliative care service, and given the high degree of certainty that she would not regain a level of function that provided the quality of life she had clearly articulated in the past she would have wanted, the decision was made to not escalate care, not prolong her in her current state futilely, and allow a natural course. ?

## 2022-03-15 DIAGNOSIS — I639 Cerebral infarction, unspecified: Secondary | ICD-10-CM

## 2022-03-15 DIAGNOSIS — F03918 Unspecified dementia, unspecified severity, with other behavioral disturbance: Secondary | ICD-10-CM

## 2022-03-15 DIAGNOSIS — G9341 Metabolic encephalopathy: Secondary | ICD-10-CM

## 2022-03-15 DIAGNOSIS — I10 Essential (primary) hypertension: Secondary | ICD-10-CM

## 2022-03-15 DIAGNOSIS — Z515 Encounter for palliative care: Secondary | ICD-10-CM

## 2022-03-15 MED ORDER — MORPHINE SULFATE (CONCENTRATE) 10 MG/0.5ML PO SOLN: 5.0000 mg | ORAL | 0 refills | Status: AC | PRN

## 2022-03-15 MED ORDER — LORAZEPAM 1 MG PO TABS: 1.0000 mg | ORAL_TABLET | Freq: Four times a day (QID) | ORAL | Status: DC | PRN

## 2022-03-15 MED ORDER — MORPHINE SULFATE (CONCENTRATE) 10 MG/0.5ML PO SOLN
5.0000 mg | ORAL | Status: DC | PRN
  Administered 2022-03-15: 5 mg via ORAL
  Filled 2022-03-15: qty 0.5

## 2022-03-15 MED ORDER — ACETAMINOPHEN 325 MG PO TABS
650.0000 mg | ORAL_TABLET | ORAL | Status: AC | PRN
Start: 2022-03-15 — End: ?

## 2022-03-15 MED ORDER — SENNOSIDES-DOCUSATE SODIUM 8.6-50 MG PO TABS: 1.0000 | ORAL_TABLET | Freq: Every evening | ORAL | Status: AC | PRN

## 2022-03-15 MED ORDER — LORAZEPAM 1 MG PO TABS: 1.0000 mg | ORAL_TABLET | Freq: Four times a day (QID) | ORAL | 0 refills | Status: AC | PRN

## 2022-03-15 NOTE — Progress Notes (Signed)
Patient ID: Tammie Wells, female   DOB: 1935-12-27, 86 y.o.   MRN: [id number] ? ? ? ?Progress Note from the Palliative Medicine Team at [facility] ? ? ?Patient Name: Tammie Wells        ?Date: 03/15/2022 ?DOB: Mar 13, 1936  Age: 86 y.o. MRN#: [id number] ?Attending Physician: Edwin Dada, * ?Primary Care Physician: Edmonia Caprio, NP ?Admit Date: 03/12/2022 ? ? ?Medical records reviewed  ? ?86 y.o. female   admitted on 03/12/2022 from [facility] memory care with past medical history significant for ES-dementia, hypertension, breast cancer.  She was admitted from facility with worsening confusion.  CT negative, MRI with probable small acute early subacute infarct in right occipital lobe. ? ?Patient currently resides at [facility] memory care unit ?  ?Today patient is minimally respond,  nonverbal and unable to follow commands.  She is taking sips only ?  ?Family face treatment option decisions, advanced directive decisions and anticipatory care needs. ? ?This NP visited patient at the bedside as a follow up to  yesterday's [place] and to met with [place] for continued conversation regarding current medical situation and neck steps in plan of care. ? ?Healthcare power of attorney presented documents for living will, declaration for a natural death and H POA. ? ?Education offered on the natural trajectory of stroke with superimposed end-stage dementia.  Education offered on the concept of adult failure to thrive and the limitations of medical interventions to prolong quality of life when the body does fail to thrive. ? ? ?Plan of Care ?Comfort, quality and dignity are the focus of care ? ?-DNR/DNI ?-No artificial feeding now or in the future/comfort feeds as tolerated  ?-No further diagnostic;  labs, scans, testing ?-Symptom management ?      -Roxanol/Ativan ?-Family is hopeful for transition to residential hospice for end-of-life care.  [facility] is [facility]  [place]. ?-Prognosis is likely less than 2 weeks. (Increased lethargy, limited oral intake) ? ?Of note healthcare power of attorney offered information and concern that patient's son may attempt to present himself as decision-maker/he is not, at times he tends to be aggressive.   ?I attempted to call son Didi Ganaway and was able to contact him, I was hoping to give him updates on his mother's condition as permitted by healthcare power of attorney. ? ?In response to concern over son's behavior I contacted security Officer Andrey Campanile, I discussed with charge nurse and bedside nurse and as recommended by security instructed nursing to contact security if and when son comes to the bedside for a visit. ? ?Questions and concerns addressed   Discussed with Dr Loleta Books and bedside RN, charge nurse  ? ? ?Wadie Lessen NP  ?Palliative Medicine Team Team Phone # [phone] ?Pager [phone] ?  ?

## 2022-03-15 NOTE — TOC Initial Note (Signed)
Transition of Care (TOC) - Initial/Assessment Note  ? ? ?Patient Details  ?Name: Jaidy Cottam ?MRN: [id number] ?Date of Birth: Dec 03, 1935 ? ?Transition of Care [facility]) CM/SW Contact:    ?Baldemar Lenis, LCSW ?Phone Number: ?03/15/2022, 11:11 AM ? ?Clinical Narrative:         CSW notified by palliative NP that patient's family is interested in residential hospice with [facility]. CSW contacted liaison with [facility] to provide referral. There's no bed available for today in [facility], but they will reach out to family and work up referral. CSW to follow.        ? ? ?Expected Discharge Plan: Hospice Medical Facility ?Barriers to Discharge: Hospice Bed not available, Continued Medical Work up ? ? ?Patient Goals and CMS Choice ?Patient states their goals for this hospitalization and ongoing recovery are:: patient unable to participate in goal setting, not oriented ?CMS Medicare.gov Compare Post Acute Care list provided to:: Patient Represenative (must comment) ?Choice offered to / list presented to : [facility] POA / Guardian ? ?Expected Discharge Plan and Services ?Expected Discharge Plan: Hospice Medical Facility ?  ?  ?Post Acute Care Choice: Hospice ?Living arrangements for the past 2 months: Assisted Living Facility ?                ?  ?  ?  ?  ?  ?  ?  ?  ?  ?  ? ?Prior Living Arrangements/Services ?Living arrangements for the past 2 months: Assisted Living Facility ?Lives with:: Facility Resident ?Patient language and need for interpreter reviewed:: No ?Do you feel safe going back to the place where you live?: Yes      ?Need for Family Participation in Patient Care: Yes (Comment) ?Care giver support system in place?: No (comment) ?  ?Criminal Activity/Legal Involvement Pertinent to Current Situation/Hospitalization: No - Comment as needed ? ?Activities of Daily Living ?Home Assistive Devices/Equipment: None ?ADL Screening (condition at time of admission) ?Patient's cognitive ability adequate to  safely complete daily activities?: No ?Is the patient deaf or have difficulty hearing?: No ?Does the patient have difficulty seeing, even when wearing glasses/contacts?: Yes ?Does the patient have difficulty concentrating, remembering, or making decisions?: Yes ?Patient able to express need for assistance with ADLs?: No ?Does the patient have difficulty dressing or bathing?: Yes ?Independently performs ADLs?: No ?Communication: Dependent ?Is this a change from baseline?: Pre-admission baseline ?Dressing (OT): Dependent ?Is this a change from baseline?: Pre-admission baseline ?Grooming: Dependent ?Is this a change from baseline?: Pre-admission baseline ?Feeding: Needs assistance ?Is this a change from baseline?: Pre-admission baseline ?Bathing: Dependent ?Is this a change from baseline?: Pre-admission baseline ?Toileting: Dependent ?Is this a change from baseline?: Pre-admission baseline ?In/Out Bed: Dependent ?Is this a change from baseline?: Pre-admission baseline ?Walks in Home: Dependent ?Is this a change from baseline?: Pre-admission baseline ?Does the patient have difficulty walking or climbing stairs?: Yes ?Weakness of Legs: Both ?Weakness of Arms/Hands: Both ? ?Permission Sought/Granted ?Permission sought to share information with : Facility Medical sales representative, Family Supports ?Permission granted to share information with : Yes, Verbal Permission Granted ? Share Information with NAME: Rene Kocher ? Permission granted to share info w AGENCY: [facility] ? Permission granted to share info w Relationship: Daughter in law/HCPOA ?   ? ?Emotional Assessment ?  ?Attitude/Demeanor/Rapport: Unable to Assess ?Affect (typically observed): Unable to Assess ?Orientation: : Oriented to Self ?Alcohol / Substance Use: Not Applicable ?Psych Involvement: No (comment) ? ?Admission diagnosis:  Hypokalemia [E87.6] ?Acute ischemic stroke (HCC) [I63.9] ?Altered mental status, unspecified altered mental status type  [R41.82] ?Acute metabolic encephalopathy [G93.41] ?Patient Active Problem List  ? Diagnosis Date Noted  ? Hypophosphatemia 03/14/2022  ? Acute ischemic stroke (HCC) 03/13/2022  ? Dementia with behavioral disturbance (HCC) 03/12/2022  ? Hypokalemia 03/12/2022  ? CVA (cerebral vascular accident) (HCC) 03/12/2022  ? Acute metabolic encephalopathy 03/12/2022  ? Essential hypertension 03/12/2022  ? ?PCP:  Ashley Royalty, NP ?Pharmacy:  No Pharmacies Listed ? ? ? ?Social Determinants of Health (SDOH) Interventions ?  ? ?Readmission Risk Interventions ?   ? View : No data to display.  ?  ?  ?  ? ? ? ?

## 2022-03-15 NOTE — Progress Notes (Signed)
?  Progress Note ? ? ?Patient: Tammie Wells [id number] DOB: [birth year] DOA: 03/12/2022     2 ?DOS: the patient was seen and examined on 03/15/2022 at 2:05PM ?  ? ? ? ?Brief hospital course: ?Mrs. Kirtley is an 86 y.o. F with hx cerebrovascular disease, vascular dementia, bedbound at baseline, history BrCA who presented with decreased mentation.   ? ?MRI showed a probable small acute to early subacute infarct in the right occipital lobe periventricular white matter.    ? ?The patient's mentation remained poor, and she refused all oral intake.  Family discussed goals of care with MD and Palliative care service, and given the high degree of certainty that she would not regain a level of function that provided the quality of life she had clearly articulated in the past she would have wanted, the decision was made to not escalate care, not prolong her in her current state futilely, and allow a natural course. ? ? ? ? ?Assessment and Plan: ?* Acute metabolic encephalopathy ?CVA (cerebral vascular accident) [facility]) ?Hypophosphatemia ?Hypokalemia ?Essential hypertension ?Dementia with behavioral disturbance ([place]) ?- Okay to continue sertraline, metoprolol, lisinopril for comfort. ?- Consult Hospice ?- Hold IV fluids ?- No escalation of care ? ? ? ? ? ? ? ? ?Subjective: The patient is nonverbal to me despite noxious stimuli.  She gives one word answers to nursing, but not consistently.She makes eye contact with family, reportedly.  She is incontinent of bowel and bladder and refuses all oral intake.  She is limp during all nursing cares. ? ? ? ? ?Physical Exam: ?Vitals:  ? 03/15/22 0800 03/15/22 0844 03/15/22 1000 03/15/22 1157  ?BP: (!) 126/94 (!) 163/63 (!) 141/74 (!) 159/79  ?Pulse:    65  ?Resp: $Remov'15  13 15  'OnPbFT$ ?Temp:    98.6 ?F (37 ?C)  ?TempSrc:    Oral  ?SpO2:    97%  ?Weight:      ? ?Elderly adult female.  Lying in bed, eyes closed, no spontaneous verbalizations or movements. ?RRR no murmurs, no peripheral edema ?Respiratory  effort normal.  No rales or wheees appreciated no exam. ?No grimace to abdominal exam.  ? ? ? ? ?Family Communication: Daughter ? ? ? ?Disposition: ?Status is: Inpatient ?To Residential Hospice ? ? ? ? ? ? ? ?Author: ?Edwin Dada, MD ?03/15/2022 2:45 PM ? ?For on call review [email or web address].  ? ? ?

## 2022-03-15 NOTE — Progress Notes (Signed)
SLP Cancellation Note ? ?Patient Details ?Name: Tammie Wells ?MRN: [id number] ?DOB: [birth year] ? ? ?Cancelled treatment:       Reason Eval/Treat Not Completed: Other (comment) (received communication that family reports concern for pt having worsening dysphagia, currently meeting with palliative care team, suspect prognosis for swallow function to recover is poor given h/o dementia, ongoing AMS, and prior CVAs.   Will continue efforts and follow up after palliative meeting re: GOC.  ? ?Rolena Infante, MS [facility] SLP ?Acute Rehab Services ?Office [phone] ?Pager [phone] ? ? ?Chales Abrahams ?03/15/2022, 10:01 AM ?

## 2022-03-15 NOTE — TOC Transition Note (Signed)
Transition of Care (TOC) - CM/SW Discharge Note ? ? ?Patient Details  ?Name: Tammie Wells ?MRN: [id number] ?Date of Birth: Sep 15, 1936 ? ?Transition of Care [facility]) CM/SW Contact:  ?Baldemar Lenis, LCSW ?Phone Number: ?03/15/2022, 3:55 PM ? ? ?Clinical Narrative:   CSW notified by [facility] liaison that a bed unexpectedly opened up for the patient to admit. CSW notified MD who completed discharge. CSW sent discharge information to hospice home and scheduled transport with [facility] for after 5:30, per hospice home request. ? ?Nurse to call report to [phone]. ? ? ? ?Final next level of care: Skilled Nursing Facility ?Barriers to Discharge: Barriers Resolved ? ? ?Patient Goals and CMS Choice ?Patient states their goals for this hospitalization and ongoing recovery are:: patient unable to participate in goal setting, not oriented ?CMS Medicare.gov Compare Post Acute Care list provided to:: Patient Represenative (must comment) ?Choice offered to / list presented to : [facility] POA / Guardian ? ?Discharge Placement ?  ?           ?  ?Patient to be transferred to facility by: [facility] ?Name of family member notified: Rene Kocher ?Patient and family notified of of transfer: 03/15/22 ? ?Discharge Plan and Services ?  ?  ?Post Acute Care Choice: Hospice          ?  ?  ?  ?  ?  ?  ?  ?  ?  ?  ? ?Social Determinants of Health (SDOH) Interventions ?  ? ? ?Readmission Risk Interventions ?   ? View : No data to display.  ?  ?  ?  ? ? ? ? ? ?

## 2022-03-15 NOTE — Discharge Summary (Signed)
?Physician Discharge Summary ?  ?Patient: Tammie Wells MRN: [id number] DOB: 19-Mar-1936  ?Admit date:     03/12/2022  ?Discharge date: 03/15/22  ?Discharge Physician: Edwin Dada  ? ?PCP: Edmonia Caprio, NP  ? ? ? ?Follow up at discharge:  ?Transition to Residential Hospice ? ? ? ? ?Discharge Diagnoses: ?Principal Problem: ?  Acute metabolic encephalopathy due to acute ischemic stroke ?Active Problems: ?  CVA (cerebral vascular accident) ([place]) ?  Dementia with behavioral disturbance ([place]) ?  Hypokalemia ?  Essential hypertension ?  Hypophosphatemia ? ? ? ? ? ? ?Hospital Course: ?Tammie Wells is an 86 y.o. F with hx cerebrovascular disease, vascular dementia, bedbound at baseline, history BrCA who presented with decreased mentation.   ?  ?MRI showed a probable small acute to early subacute infarct in the right occipital lobe periventricular white matter.    ?  ?The patient's mentation remained poor, and she refused all oral intake.  Family discussed goals of care with MD and Palliative care service, and given the high degree of certainty that she would not regain a level of function that provided the quality of life she had clearly articulated in the past she would have wanted, the decision was made to not escalate care, not prolong her in her current state futilely, and allow a natural course. ?  ?Her medications for comfort were continued, all others were stopped.  Hospice was engaged and offered a bed in residential hospice.  ?  ?  ? ? ?  ? ? ? ? ? ?  ? ?Consultants: Palliative Care, Neurology ? ?Disposition: Residential Hospice ? ? ?DISCHARGE MEDICATION: ?Allergies as of 03/15/2022   ? ?   Reactions  ? Latex Rash  ? Sulfa Antibiotics Other (See Comments)  ? Unknown. ?Hospitalized when pt. Was a child  ? ?  ? ?  ?Medication List  ?  ? ?STOP taking these medications   ? ?aspirin 81 MG EC tablet ?  ?atorvastatin 80 MG tablet ?Commonly known as: LIPITOR ?  ?gabapentin 100 MG capsule ?Commonly known as:  NEURONTIN ?  ?sertraline 50 MG tablet ?Commonly known as: ZOLOFT ?  ?Vitamin D (Ergocalciferol) 1.25 MG (50000 UNIT) Caps capsule ?Commonly known as: DRISDOL ?  ? ?  ? ?TAKE these medications   ? ?acetaminophen 325 MG tablet ?Commonly known as: TYLENOL ?Take 2 tablets (650 mg total) by mouth every 4 (four) hours as needed for mild pain (or temp > 37.5 C (99.5 F)). ?  ?lisinopril 20 MG tablet ?Commonly known as: ZESTRIL ?Take 20 mg by mouth daily. ?  ?LORazepam 1 MG tablet ?Commonly known as: ATIVAN ?Take 1 tablet (1 mg total) by mouth every 6 (six) hours as needed for anxiety. ?  ?metoprolol succinate 25 MG 24 hr tablet ?Commonly known as: TOPROL-XL ?Take 25 mg by mouth daily. ?  ?morphine CONCENTRATE 10 MG/0.5ML Soln concentrated solution ?Take 0.25 mLs (5 mg total) by mouth every hour as needed for moderate pain or shortness of breath. ?  ?senna-docusate 8.6-50 MG tablet ?Commonly known as: Senokot-S ?Take 1 tablet by mouth at bedtime as needed for moderate constipation. ?  ? ?  ? ? Follow-up Information   ? ? [facility]. Schedule an appointment as soon as possible for a visit in 1 month(s).   ?Specialty: Neurology ?Why: stroke clinic ?Contact information: ?[address] [place] ?[phone] ? ?  ?  ? ?  ?  ? ?  ? ? ? ? ?  Discharge Exam: ?Filed Weights  ? 03/12/22 1200  ?Weight: 54 kg  ? ? ?General: Pt is not alert, does not respond to stimuli, stirs weakly but keeps eyes closed ?Cardiovascular: RRR, nl S1-S2, no murmurs appreciated.   No LE edema.   ?Respiratory: Normal respiratory rate and rhythm.  CTAB without rales or wheezes. ?Abdominal: Abdomen soft and no grimace to palpation ?Neuro/Psych: No spontaneous movemetns or verbalizations, no response to stimuli, wtihdraws from pain, resists movement, does not follow commands   ? ? ?Condition at discharge: Deteriorating ? ?The results of significant diagnostics from this hospitalization (including imaging,  microbiology, ancillary and laboratory) are listed below for reference.  ? ?Imaging Studies: ?MR BRAIN WO CONTRAST ? ?Result Date: 03/12/2022 ?CLINICAL DATA:  Found approximately 9 a.m. slumped over in wheelchair, confusion, aphasia, difficulty arousing. History of TIA EXAM: MRI HEAD WITHOUT CONTRAST TECHNIQUE: Multiplanar, multiecho pulse sequences of the brain and surrounding structures were obtained without intravenous contrast. COMPARISON:  Same-day noncontrast CT head FINDINGS: Study could not be completed due to patient cooperation. Motion degraded axial and coronal DWI, and axial FLAIR sequences were obtained. Brain: There is a small focus of elevated DWI signal in the right occipital lobe periventricular white matter seen on the coronal DWI sequence suspicious for acute to early subacute infarct (9-42). There is no other evidence of acute infarct. There is no large intraparenchymal hemorrhage. There is mild-to-moderate global parenchymal volume loss with prominence of the ventricular system and extra-axial CSF spaces. The ventricles are similar in size compared to the CT from 2020. There is extensive confluent FLAIR signal abnormality throughout the subcortical and periventricular white matter likely reflecting sequela of advanced chronic white matter microangiopathy. There are remote lacunar infarcts in the bilateral thalami and left lentiform nucleus. There is no large mass lesion.  There is no midline shift. Vascular: Not well assessed. Skull and upper cervical spine: Not assessed. Sinuses/Orbits: Paranasal sinuses appear grossly clear. The globes and orbits are not well assessed. Other: None. IMPRESSION: 1. Incomplete study due to patient cooperation as above. 2. Probable small acute to early subacute infarct in the right occipital lobe periventricular white matter. No other evidence of acute intracranial pathology. 3. Global parenchymal volume loss and advanced chronic white matter microangiopathy.  Electronically Signed   By: Valetta Mole M.D.   On: 03/12/2022 17:52  ? ?DG Abd Acute W/Chest ? ?Result Date: 03/12/2022 ?CLINICAL DATA:  Altered mental status. EXAM: DG ABDOMEN ACUTE WITH 1 VIEW CHEST COMPARISON:  None. FINDINGS: There is no evidence of dilated bowel loops or free intraperitoneal air. No radiopaque calculi or other significant radiographic abnormality is seen. Heart size and mediastinal contours are within normal limits. Both lungs are clear. Marked severity calcification of the thoracic aorta is noted. IMPRESSION: Negative abdominal radiographs.  No acute cardiopulmonary disease. Electronically Signed   By: Virgina Norfolk M.D.   On: 03/12/2022 19:34  ? ?EEG adult ? ?Result Date: 03/13/2022 ?Lora Havens, MD     03/13/2022  8:23 AM Patient Name: Tammie Wells MRN: [id number] Epilepsy Attending: Lora Havens Referring Physician/Provider: Greta Doom, MD Date: 03/12/2022 Duration: 23.26 mins Patient history:  86 year old female with advanced dementia with worsening confusion. EEG to evaluate for seizure Level of alertness: Awake, asleep AEDs during EEG study: None Technical aspects: This EEG study was done with scalp electrodes positioned according to the 10-20 International system of electrode placement. Electrical activity was acquired at a sampling rate of '500Hz'  and reviewed with a high frequency  filter of '70Hz'  and a low frequency filter of '1Hz' . EEG data were recorded continuously and digitally stored. Description: The posterior dominant rhythm consists of 7 Hz activity of moderate voltage (25-35 uV) seen predominantly in posterior head regions, symmetric and reactive to eye opening and eye closing. Sleep was characterized by sleep spindles (12 to 14 Hz), maximal frontocentral region.  EEG showed continuous generalized 3 to 6 Hz theta-delta slowing. Hyperventilation and photic stimulation were not performed.   ABNORMALITY - Continuous slow, generalized - Background slow IMPRESSION: This  study is suggestive of moderate diffuse encephalopathy, nonspecific etiology. No seizures or epileptiform discharges were seen throughout the recording. Priyanka Barbra Sarks  ? ?ECHOCARDIOGRAM COMPLETE ? ?Result Da

## 2022-03-15 NOTE — Progress Notes (Signed)
Report called to [facility] ?

## 2022-03-16 NOTE — Progress Notes (Signed)
Received call from patient's son Tammie Wells.  He requested information about his mother, and explained that he had permission from [facility], which is coincident with my understanding of her instructions to me.  I shared that patient had had stroke, and subsequently observed in the hospital to have failure to thrive.  Throughout her time in the hospital she had severe expressive aphasia, was progressively less responsive to the point of being minimally responsive, anergic and weak despite medical aggressive medical treatments, consistently did not make intelligible answers to any meaningful questions, did not participate in cares or interact with staff, kept eyes closed.  Although she initially fed herself finger foods, the first day, she subsequently required feeding, and then kept her mouth closed to all medicines and oral intake. Appropriate medical treatments were offered and a reasonable effort to identify other causes of encephalopathy was completed (there were none), but it became increasingly clear that her condition was from well known pre-existing vascular dementia, now worsened to end stage.  Given that medical treatments had failed to improve her condition, and in keeping with her filed advanced directives, a referral to Hospice was made.  The POA made the decision to enter residential hospice in discussion with representatives of Hospice as well as our Palliative Care.   ? ?During our discussion, the patient's son repeatedly used aggressive and obscene language, and his statements about his mother's condition this week conflicted directly with my own observations, that of Palliative Care and of our nursing staff (our assessment being that she was entering the dying process with a high degree of certainty and that she was too aphasic and encephalopathic to have any meaningful interactions with others throughout her hospitalization). ?

## 2022-04-12 DEATH — deceased

## 2022-11-25 IMAGING — MR MR HEAD W/O CM
7 series · 48 of 48 positions shown · non-contrast
Comparison: Same-day noncontrast CT head

CLINICAL DATA: Found approximately 9 a.m. slumped over in
wheelchair, confusion, aphasia, difficulty arousing. History of TIA

EXAM:
MRI HEAD WITHOUT CONTRAST
TECHNIQUE: Multiplanar, multiecho pulse sequences of the brain and surrounding
structures were obtained without intravenous contrast.

[Series 5: DWI · axial · 3.0mm · 0.88mm/px · z∈[-147,+4]mm · 11 of 104 slices shown (1 of 6)]
[im 1/104]
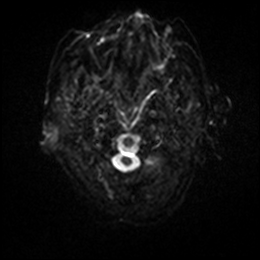
[im 11/104]
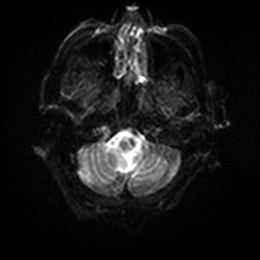
[im 21/104]
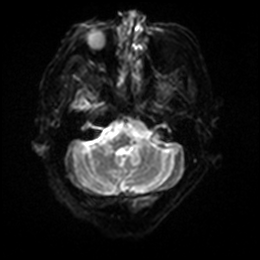
[im 31/104]
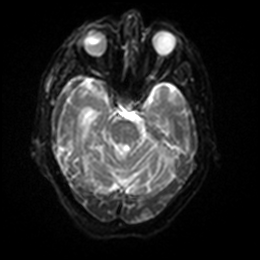
[im 42/104]
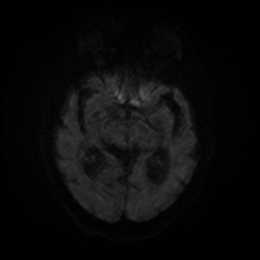
[im 52/104]
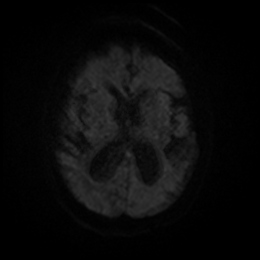
[im 62/104]
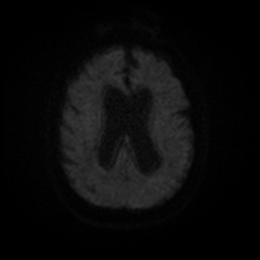
[im 73/104]
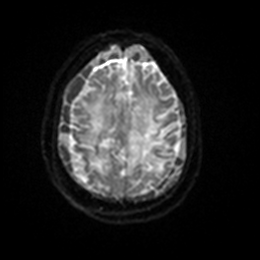
[im 83/104]
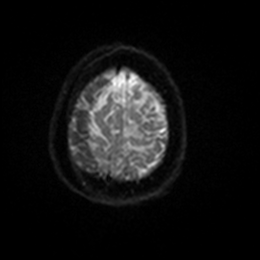
[im 93/104]
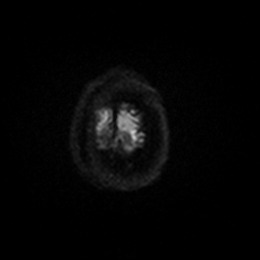
[im 104/104]
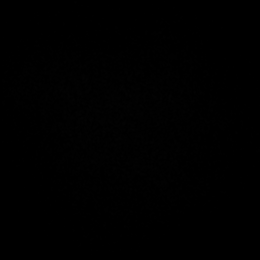

[Series 6: DWI · axial · 3.0mm · 0.88mm/px · z∈[-147,+4]mm · 5 of 52 slices shown (2 of 6)]
[im 1/52]
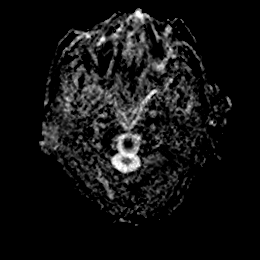
[im 13/52]
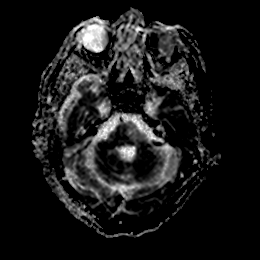
[im 26/52]
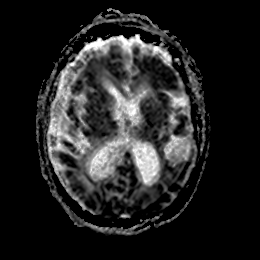
[im 39/52]
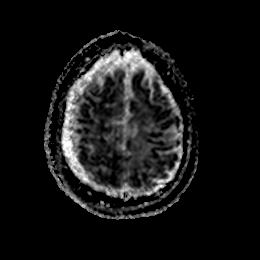
[im 52/52]
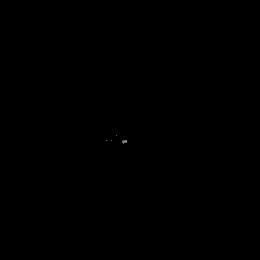

[Series 7: DWI · axial · 3.0mm · 0.88mm/px · z∈[-147,+4]mm · 12 of 104 slices shown (3 of 6)]
[im 1/104]
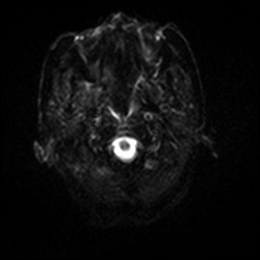
[im 10/104]
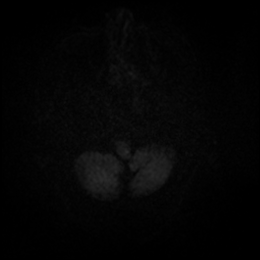
[im 19/104]
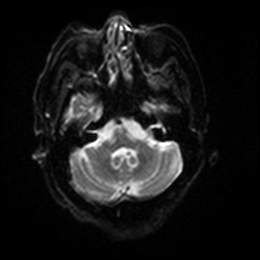
[im 29/104]
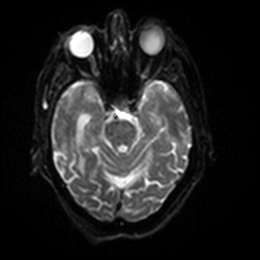
[im 38/104]
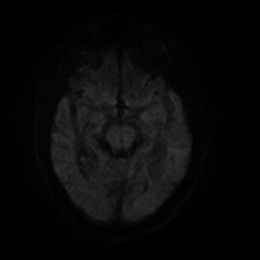
[im 47/104]
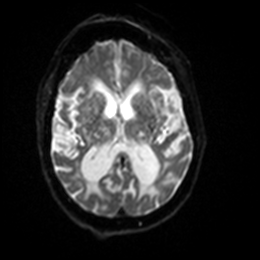
[im 57/104]
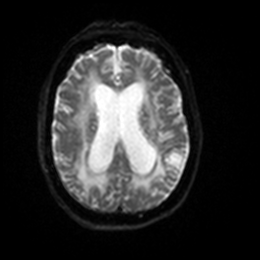
[im 66/104]
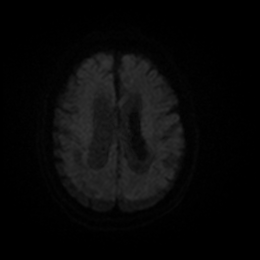
[im 75/104]
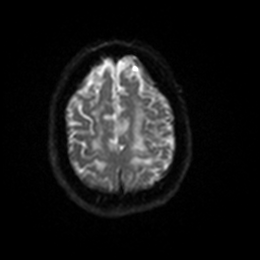
[im 85/104]
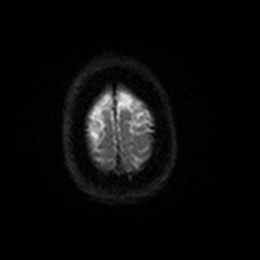
[im 94/104]
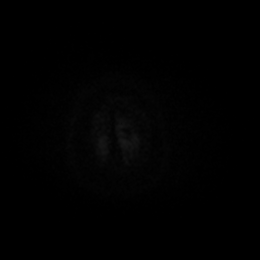
[im 104/104]
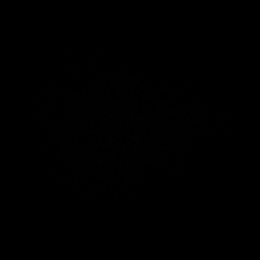

[Series 8: DWI · axial · 3.0mm · 0.88mm/px · z∈[-147,-2]mm · 6 of 50 slices shown (4 of 6)]
[im 1/50]
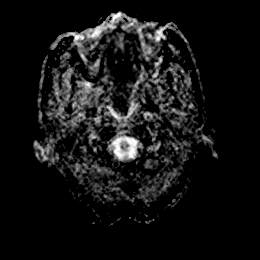
[im 10/50]
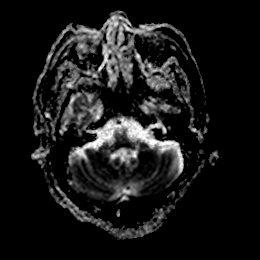
[im 20/50]
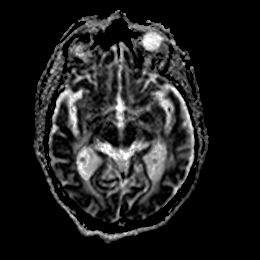
[im 30/50]
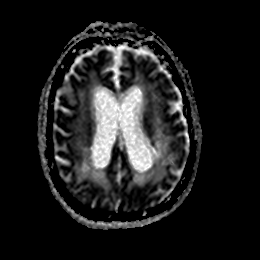
[im 40/50]
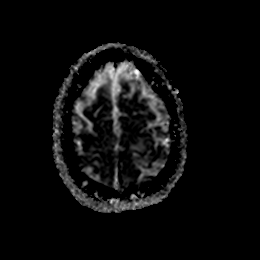
[im 50/50]
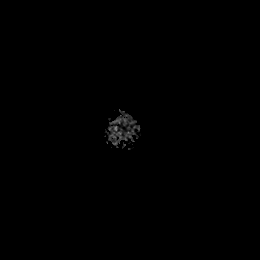

[Series 9: DWI · coronal · 4.0mm · 0.88mm/px · 7 of 64 slices shown (5 of 6)]
[im 1/64]
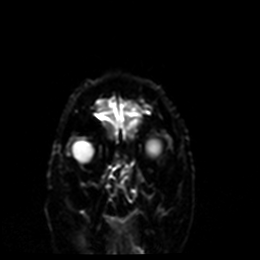
[im 11/64]
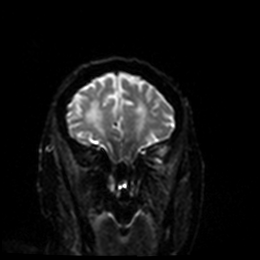
[im 22/64]
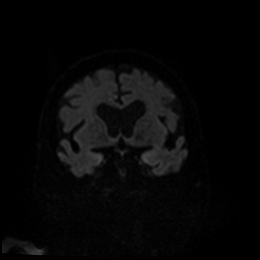
[im 32/64]
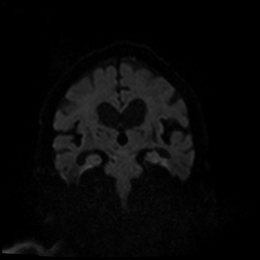
[im 43/64]
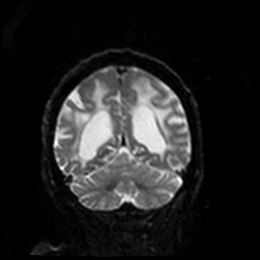
[im 53/64]
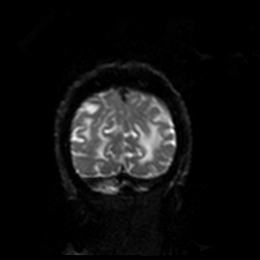
[im 64/64]
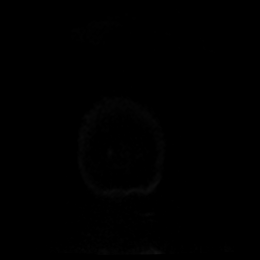

[Series 10: DWI · coronal · 4.0mm · 0.88mm/px · 4 of 32 slices shown (6 of 6)]
[im 1/32]
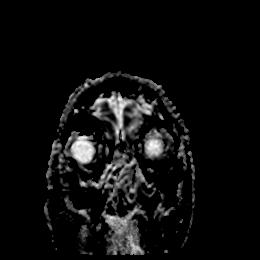
[im 11/32]
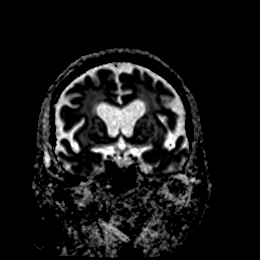
[im 21/32]
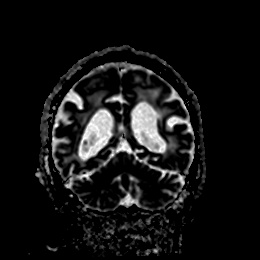
[im 32/32]
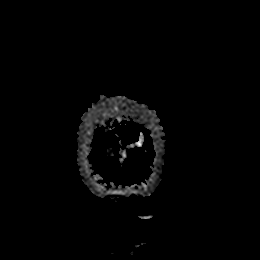

[Series 11: FLAIR · axial · 5.0mm · 0.90mm/px · z∈[-159,-17]mm · 3 of 25 slices shown]
[im 1/25]
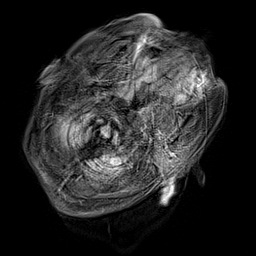
[im 13/25]
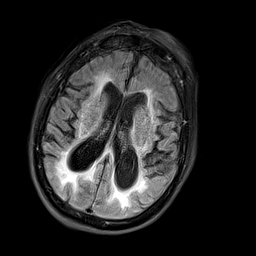
[im 25/25]
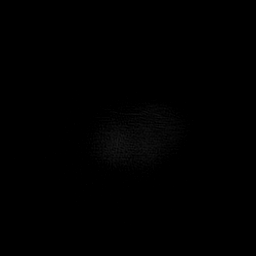

[48 of 48 positions shown; findings below may reference images not displayed]

FINDINGS: Study could not be completed due to patient cooperation. Motion
degraded axial and coronal DWI, and axial FLAIR sequences were
obtained.

Brain: There is a small focus of elevated DWI signal in the right
occipital lobe periventricular white matter seen on the coronal DWI
sequence suspicious for acute to early subacute infarct (9-42).
There is no other evidence of acute infarct. There is no large
intraparenchymal hemorrhage.

There is mild-to-moderate global parenchymal volume loss with
prominence of the ventricular system and extra-axial CSF spaces. The
ventricles are similar in size compared to the CT from 8686. There
is extensive confluent FLAIR signal abnormality throughout the
subcortical and periventricular white matter likely reflecting
sequela of advanced chronic white matter microangiopathy. There are
remote lacunar infarcts in the bilateral thalami and left lentiform
nucleus.

There is no large mass lesion.  There is no midline shift.

Vascular: Not well assessed.

Skull and upper cervical spine: Not assessed.

Sinuses/Orbits: Paranasal sinuses appear grossly clear. The globes
and orbits are not well assessed.

Other: None.
IMPRESSION: 1. Incomplete study due to patient cooperation as above.
2. Probable small acute to early subacute infarct in the right
occipital lobe periventricular white matter. No other evidence of
acute intracranial pathology.
3. Global parenchymal volume loss and advanced chronic white matter
microangiopathy.
